# Patient Record
Sex: Female | Born: 1975 | Race: White | Hispanic: No | Marital: Single | Smoking: Current every day smoker
Health system: Southern US, Community
[De-identification: ages and names within clinical notes are randomized; demographics above are authoritative.]

## PROBLEM LIST (undated history)

## (undated) DIAGNOSIS — G629 Polyneuropathy, unspecified: Secondary | ICD-10-CM

## (undated) DIAGNOSIS — D649 Anemia, unspecified: Secondary | ICD-10-CM

## (undated) DIAGNOSIS — F1010 Alcohol abuse, uncomplicated: Secondary | ICD-10-CM

## (undated) HISTORY — PX: GASTRIC BYPASS: SHX52

---

## 2018-03-06 ENCOUNTER — Emergency Department: Payer: Self-pay

## 2018-03-06 ENCOUNTER — Emergency Department
Admission: EM | Admit: 2018-03-06 | Discharge: 2018-03-06 | Disposition: A | Discharge status: Home / Self Care | Payer: Self-pay | Attending: Emergency Medicine | Admitting: Emergency Medicine

## 2018-03-06 DIAGNOSIS — R55 Syncope and collapse: Secondary | ICD-10-CM

## 2018-03-06 DIAGNOSIS — Y999 Unspecified external cause status: Secondary | ICD-10-CM | POA: Insufficient documentation

## 2018-03-06 DIAGNOSIS — Y9289 Other specified places as the place of occurrence of the external cause: Secondary | ICD-10-CM | POA: Insufficient documentation

## 2018-03-06 DIAGNOSIS — Y9389 Activity, other specified: Secondary | ICD-10-CM | POA: Insufficient documentation

## 2018-03-06 DIAGNOSIS — S0990XA Unspecified injury of head, initial encounter: Secondary | ICD-10-CM | POA: Insufficient documentation

## 2018-03-06 DIAGNOSIS — W01198A Fall on same level from slipping, tripping and stumbling with subsequent striking against other object, initial encounter: Secondary | ICD-10-CM | POA: Insufficient documentation

## 2018-03-06 DIAGNOSIS — F17210 Nicotine dependence, cigarettes, uncomplicated: Secondary | ICD-10-CM | POA: Insufficient documentation

## 2018-03-06 LAB — CBC
HCT: 30.8 % — ABNORMAL LOW (ref 36.0–46.0)
Hemoglobin: 9.2 g/dL — ABNORMAL LOW (ref 12.0–15.0)
MCH: 23.1 pg — ABNORMAL LOW (ref 26.0–34.0)
MCHC: 29.9 g/dL — AB (ref 30.0–36.0)
MCV: 77.2 fL — ABNORMAL LOW (ref 80.0–100.0)
Platelets: 187 10*3/uL (ref 150–400)
RBC: 3.99 MIL/uL (ref 3.87–5.11)
RDW: 24.1 % — AB (ref 11.5–15.5)
WBC: 5.6 10*3/uL (ref 4.0–10.5)
nRBC: 0 % (ref 0.0–0.2)

## 2018-03-06 LAB — COMPREHENSIVE METABOLIC PANEL
ALT: 46 U/L — ABNORMAL HIGH (ref 0–44)
AST: 65 U/L — ABNORMAL HIGH (ref 15–41)
Albumin: 3.7 g/dL (ref 3.5–5.0)
Alkaline Phosphatase: 160 U/L — ABNORMAL HIGH (ref 38–126)
Anion gap: 12 (ref 5–15)
BUN: 6 mg/dL (ref 6–20)
CO2: 21 mmol/L — ABNORMAL LOW (ref 22–32)
Calcium: 8.1 mg/dL — ABNORMAL LOW (ref 8.9–10.3)
Chloride: 102 mmol/L (ref 98–111)
Creatinine, Ser: 0.44 mg/dL (ref 0.44–1.00)
GFR calc non Af Amer: >60 mL/min (ref >60)
Glucose, Bld: 75 mg/dL (ref 70–99)
Potassium: 3.7 mmol/L (ref 3.5–5.1)
Sodium: 135 mmol/L (ref 135–145)
TOTAL PROTEIN: 6.7 g/dL (ref 6.5–8.1)
Total Bilirubin: 0.6 mg/dL (ref 0.3–1.2)

## 2018-03-06 MED ORDER — SODIUM CHLORIDE 0.9 % IV SOLN
1000.0000 mL | Freq: Once | INTRAVENOUS | Status: AC
  Administered 2018-03-06: 1000 mL via INTRAVENOUS

## 2018-03-06 MED ORDER — ACETAMINOPHEN 325 MG PO TABS
650.0000 mg | ORAL_TABLET | Freq: Once | ORAL | Status: AC
  Administered 2018-03-06: 650 mg via ORAL
  Filled 2018-03-06: qty 2

## 2018-03-06 NOTE — ED Triage Notes (Signed)
Pt to ED from home via [facility] c/o fall. Per EMS pt had a syncopal episode after dinner family report pt "passed out" hitting her head; hematoma noted to occipital region in addition to left shoulder. EMS reports patient has had approximately 7 beers since 11am today, and had 1 episode of vomiting and 4mg  of zofran was administered. Pt presents A&Ox4, RR even and unlabored, slin wpd.

## 2018-03-06 NOTE — ED Provider Notes (Signed)
Per request of ER MD, writer provided the pt. with information and instructions on how to access [facility] ([facility])   Writer also advised the patient to call the toll free phone on insurance card to assist with identifying in network counselors and agencies .

## 2018-03-06 NOTE — ED Provider Notes (Signed)
[facility] Emergency Department Provider Note   ____________________________________________    I have reviewed the triage vital signs and the nursing notes.   HISTORY  Chief Complaint Fall     HPI Ellen Smith is a 43 y.o. female who presents after a fall.  Patient reports that she is a heavy drinker typically drinks about 12 beers per day.  However over the last 3 days she has tried to cut back.  Today she only had about 6 or 7 beers.  After dinner she stood up to go put her plate in the sink and apparently syncopized.  No seizure activity witnessed by family.  No postictal period.  She was apparently briefly unconscious, when she woke up she felt nauseated and had to vomit.  Reports some pain to her posterior scalp.  No neck pain.  No abdominal pain or chest pain.  No back pain.  No extremity injuries except for mild bruising to the left shoulder  History reviewed. No pertinent past medical history.  There are no active problems to display for this patient.   Past Surgical History:  Procedure Laterality Date  . GASTRIC BYPASS      Prior to Admission medications   Not on File     Allergies Ceclor [cefaclor]  History reviewed. No pertinent family history.  Social History Social History   Tobacco Use  . Smoking status: Current Every Day Smoker    Packs/day: 1.00    Years: 20.00    Pack years: 20.00    Types: Cigarettes  . Smokeless tobacco: Never Used  Substance Use Topics  . Alcohol use: Yes    Alcohol/week: 12.0 standard drinks    Types: 12 Cans of beer per week  . Drug use: Not on file    Review of Systems  Constitutional: No fever/chills Eyes: No visual changes.  ENT: No neck pain Cardiovascular: Denies chest pain. Respiratory: Denies shortness of breath. Gastrointestinal: No abdominal pain. Genitourinary: No groin injury Musculoskeletal: Negative for back pain. Skin: Bruise left shoulder Neurological: No focal  weakness   ____________________________________________   PHYSICAL EXAM:  VITAL SIGNS: ED Triage Vitals  Enc Vitals Group     BP 03/06/18 1853 (!) 178/102     Pulse Rate 03/06/18 1853 100     Resp --      Temp 03/06/18 1853 99 F (37.2 C)     Temp Source 03/06/18 1853 Oral     SpO2 03/06/18 1853 97 %     Weight 03/06/18 1854 68 kg (150 lb)     Height 03/06/18 1854 1.626 m (5\' 4" )     Head Circumference --      Peak Flow --      Pain Score 03/06/18 1854 8     Pain Loc --      Pain Edu? --      Excl. in GC? --     Constitutional: Alert and oriented. No acute distress.  Eyes: Conjunctivae are normal.  Head: Hematoma, small posterior scalp, no bleeding Nose: No swelling or epistaxis Mouth/Throat: Mucous membranes are moist.   Neck:  Painless ROM no vertebral tenderness to palpation Cardiovascular: Normal rate, regular rhythm. Grossly normal heart sounds.  Good peripheral circulation. Respiratory: Normal respiratory effort.  No retractions. Lungs CTAB. Gastrointestinal: Soft and nontender. No distention.  Musculoskeletal:  Warm and well perfused Neurologic:  Normal speech and language. Skin:  Skin is warm, dry and intact. No rash noted. Psychiatric: Mood and affect  are normal. Speech and behavior are normal.  ____________________________________________   LABS (all labs ordered are listed, but only abnormal results are displayed)  Labs Reviewed  CBC - Abnormal; Notable for the following components:      Result Value   Hemoglobin 9.2 (*)    HCT 30.8 (*)    MCV 77.2 (*)    MCH 23.1 (*)    MCHC 29.9 (*)    RDW 24.1 (*)    All other components within normal limits  COMPREHENSIVE METABOLIC PANEL - Abnormal; Notable for the following components:   CO2 21 (*)    Calcium 8.1 (*)    AST 65 (*)    ALT 46 (*)    Alkaline Phosphatase 160 (*)    All other components within normal limits   ____________________________________________  EKG  ED ECG REPORT I, Jene Everyobert  Hiedi Touchton, the attending physician, personally viewed and interpreted this ECG.  Date: 03/06/2018  Rhythm: normal sinus rhythm QRS Axis: normal Intervals: normal ST/T Wave abnormalities: normal Narrative Interpretation: no evidence of acute ischemia  ____________________________________________  RADIOLOGY  CT head unremarkable ____________________________________________   PROCEDURES  Procedure(s) performed: No  Procedures   Critical Care performed: No ____________________________________________   INITIAL IMPRESSION / ASSESSMENT AND PLAN / ED COURSE  Pertinent labs & imaging results that were available during my care of the patient were reviewed by me and considered in my medical decision making (see chart for details).  Patient presents after syncopal episode, likely related to EtOH withdrawal.  Will check labs, give IV fluids, obtain CT head given trauma and reevaluate  Patient's CT head reassuring. Asked TTS to provide detox resources at patient's request. Lab work overall reassuring.    ____________________________________________   FINAL CLINICAL IMPRESSION(S) / ED DIAGNOSES  Final diagnoses:  Injury of head, initial encounter  Syncope, unspecified syncope type        Note:  This document was prepared using Dragon voice recognition software and may include unintentional dictation errors.   Jene EveryKinner, [place] Dimmitt, MD 03/06/18 2027

## 2018-09-01 ENCOUNTER — Inpatient Hospital Stay
Admission: EM | Admit: 2018-09-01 | Discharge: 2018-09-04 | DRG: 917 | Disposition: A | Discharge status: Home / Self Care | Payer: Self-pay | Attending: Internal Medicine | Admitting: Internal Medicine

## 2018-09-01 ENCOUNTER — Other Ambulatory Visit: Payer: Self-pay

## 2018-09-01 DIAGNOSIS — Z20828 Contact with and (suspected) exposure to other viral communicable diseases: Secondary | ICD-10-CM | POA: Diagnosis present

## 2018-09-01 DIAGNOSIS — Z79899 Other long term (current) drug therapy: Secondary | ICD-10-CM

## 2018-09-01 DIAGNOSIS — F1110 Opioid abuse, uncomplicated: Secondary | ICD-10-CM | POA: Diagnosis present

## 2018-09-01 DIAGNOSIS — F322 Major depressive disorder, single episode, severe without psychotic features: Secondary | ICD-10-CM | POA: Diagnosis present

## 2018-09-01 DIAGNOSIS — T50901A Poisoning by unspecified drugs, medicaments and biological substances, accidental (unintentional), initial encounter: Secondary | ICD-10-CM | POA: Diagnosis present

## 2018-09-01 DIAGNOSIS — J9601 Acute respiratory failure with hypoxia: Secondary | ICD-10-CM | POA: Diagnosis present

## 2018-09-01 DIAGNOSIS — D72829 Elevated white blood cell count, unspecified: Secondary | ICD-10-CM | POA: Diagnosis present

## 2018-09-01 DIAGNOSIS — E875 Hyperkalemia: Secondary | ICD-10-CM | POA: Diagnosis present

## 2018-09-01 DIAGNOSIS — F329 Major depressive disorder, single episode, unspecified: Secondary | ICD-10-CM | POA: Diagnosis present

## 2018-09-01 DIAGNOSIS — R0902 Hypoxemia: Secondary | ICD-10-CM

## 2018-09-01 DIAGNOSIS — T401X1A Poisoning by heroin, accidental (unintentional), initial encounter: Principal | ICD-10-CM

## 2018-09-01 DIAGNOSIS — F1020 Alcohol dependence, uncomplicated: Secondary | ICD-10-CM | POA: Diagnosis present

## 2018-09-01 DIAGNOSIS — T402X1A Poisoning by other opioids, accidental (unintentional), initial encounter: Secondary | ICD-10-CM | POA: Diagnosis present

## 2018-09-01 DIAGNOSIS — F17210 Nicotine dependence, cigarettes, uncomplicated: Secondary | ICD-10-CM | POA: Diagnosis present

## 2018-09-01 DIAGNOSIS — T391X1A Poisoning by 4-Aminophenol derivatives, accidental (unintentional), initial encounter: Secondary | ICD-10-CM | POA: Diagnosis present

## 2018-09-01 DIAGNOSIS — D649 Anemia, unspecified: Secondary | ICD-10-CM | POA: Diagnosis present

## 2018-09-01 DIAGNOSIS — F10229 Alcohol dependence with intoxication, unspecified: Secondary | ICD-10-CM | POA: Diagnosis present

## 2018-09-01 DIAGNOSIS — Z8249 Family history of ischemic heart disease and other diseases of the circulatory system: Secondary | ICD-10-CM

## 2018-09-01 DIAGNOSIS — F419 Anxiety disorder, unspecified: Secondary | ICD-10-CM | POA: Diagnosis present

## 2018-09-01 HISTORY — DX: Alcohol abuse, uncomplicated: F10.10

## 2018-09-01 LAB — ACETAMINOPHEN LEVEL: Acetaminophen (Tylenol), Serum: <10 ug/mL — ABNORMAL LOW (ref 10–30)

## 2018-09-01 MED ORDER — NALOXONE HCL 2 MG/2ML IJ SOSY
1.0000 mg | PREFILLED_SYRINGE | Freq: Once | INTRAMUSCULAR | Status: AC
  Administered 2018-09-01: 1 mg via INTRAVENOUS

## 2018-09-01 MED ORDER — NALOXONE HCL 2 MG/2ML IJ SOSY
PREFILLED_SYRINGE | INTRAMUSCULAR | Status: AC
  Administered 2018-09-01: 23:00:00
  Filled 2018-09-01: qty 2

## 2018-09-01 MED ORDER — NALOXONE HCL 4 MG/0.1ML NA LIQD: NASAL | 1 refills | Status: DC

## 2018-09-01 MED ORDER — NALOXONE HCL 2 MG/2ML IJ SOSY
PREFILLED_SYRINGE | INTRAMUSCULAR | Status: AC
  Filled 2018-09-01: qty 2

## 2018-09-01 NOTE — ED Notes (Signed)
Patient placed on nonrebreather due to apnea while sleeping

## 2018-09-01 NOTE — ED Notes (Signed)
Lab called for tylenol level add on.

## 2018-09-01 NOTE — ED Notes (Signed)
Patient denies trying to hurt herself, states she was just trying to get high, did a little to much heroin. Patient awakens to name, answeres questions appropriately when asked. But can not stay awake long enough to keep her stas up, 3lnc applied, sating 96%. md at bedside to assess, safety maintained. Will monitor.

## 2018-09-01 NOTE — ED Notes (Signed)
Patient awakens to name, but falls right back to sleep. Vss, safety maintained will continue to monitor.

## 2018-09-01 NOTE — ED Triage Notes (Signed)
As per patient was home doing heroin, xanax, percocet's and drinking with her girlfriend. Had a little to much. Denies SI. Patient unresponsive upon ems arrival, 3mg  intra nasal Narcan given prior to ed arrival. Patient unable to stay awake  Long enough to answer triage questions.

## 2018-09-01 NOTE — ED Notes (Signed)
Patient aroused after Narcan dose, asking for fiance.

## 2018-09-01 NOTE — ED Notes (Signed)
Occasional desats to 80s, OK per MD to give 1mg  Narcan

## 2018-09-01 NOTE — ED Provider Notes (Signed)
[facility] Emergency Department Provider Note   ____________________________________________   I have reviewed the triage vital signs and the nursing notes.   HISTORY  Chief Complaint Drug Overdose   History limited by: Not Limited   HPI Ellen Smith is a 43 y.o. female who presents to the emergency department today after heroin overdose. The patient states she was trying to get high. Denies any thoughts of wanting to harm herself. Also states she took xanax, alcohol and pain pills. Denies taking combination pain medication. Patient did receive narcan prior to arrival to the emergency department.   Records reviewed. Per medical record review patient has a history of ER visit for alcohol intoxication earlier this month at outside hospital.   No past medical history on file.  There are no active problems to display for this patient.   Past Surgical History:  Procedure Laterality Date  . GASTRIC BYPASS      Prior to Admission medications   Not on File    Allergies Ceclor [cefaclor]  No family history on file.  Social History Social History   Tobacco Use  . Smoking status: Current Every Day Smoker    Packs/day: 1.00    Years: 20.00    Pack years: 20.00    Types: Cigarettes  . Smokeless tobacco: Never Used  Substance Use Topics  . Alcohol use: Yes    Alcohol/week: 12.0 standard drinks    Types: 12 Cans of beer per week  . Drug use: Not on file    Review of Systems Constitutional: No fever/chills Eyes: No visual changes. ENT: No sore throat. Cardiovascular: Denies chest pain. Respiratory: Denies shortness of breath. Gastrointestinal: No abdominal pain.  No nausea, no vomiting.  No diarrhea.   Genitourinary: Negative for dysuria. Musculoskeletal: Negative for back pain. Skin: Negative for rash. Neurological: Negative for headaches, focal weakness or numbness.  ____________________________________________   PHYSICAL EXAM:  VITAL  SIGNS: ED Triage Vitals  Enc Vitals Group     BP --      Pulse Rate 09/01/18 1800 (!) 106     Resp 09/01/18 1800 18     Temp 09/01/18 1800 98.6 F (37 C)     Temp Source 09/01/18 1800 Oral     SpO2 09/01/18 1800 96 %     Weight 09/01/18 1802 148 lb (67.1 kg)     Height 09/01/18 1802 5\' 3"  (1.6 m)     Head Circumference --      Peak Flow --      Pain Score 09/01/18 1801 0   Constitutional: Slightly somnolent, awakens easily to verbal stimuli.  Eyes: Conjunctivae are normal.  ENT      Head: Normocephalic and atraumatic.      Nose: No congestion/rhinnorhea.      Mouth/Throat: Mucous membranes are moist.      Neck: No stridor. Hematological/Lymphatic/Immunilogical: No cervical lymphadenopathy. Cardiovascular: Normal rate, regular rhythm.  No murmurs, rubs, or gallops.  Respiratory: Normal respiratory effort without tachypnea nor retractions. Breath sounds are clear and equal bilaterally. No wheezes/rales/rhonchi. Gastrointestinal: Soft and non tender. No rebound. No guarding.  Genitourinary: Deferred Musculoskeletal: Normal range of motion in all extremities. No lower extremity edema. Neurologic:  Slightly somnolent, awakens easily to verbal stimuli.  Skin:  Skin is warm, dry and intact. No rash noted. Psychiatric: Mood and affect are normal. Speech and behavior are normal. Patient exhibits appropriate insight and judgment.  ____________________________________________    LABS (pertinent positives/negatives)  Acetaminophen <10  ____________________________________________  EKG  None  ____________________________________________    RADIOLOGY  None  ____________________________________________   PROCEDURES  Procedures  ____________________________________________   INITIAL IMPRESSION / ASSESSMENT AND PLAN / ED COURSE  Pertinent labs & imaging results that were available during my care of the patient were reviewed by me and considered in my medical decision  making (see chart for details).   Patient presented to the emergency department today after heroin overdose. Patient denies thoughts of self harm. Did receive narcan prior to arrival. Was slightly somnolent but wakes easily with verbal stimuli. Will plan on observation.  Patient would have some desaturation while sleeping. States she thinks she might have sleep apnea. It does run in the family. The patient was given another dose of narcan to help her stay more awake to help oxygenation.    ____________________________________________   FINAL CLINICAL IMPRESSION(S) / ED DIAGNOSES  Opioid overdose  Note: This dictation was prepared with Dragon dictation. Any transcriptional errors that result from this process are unintentional     Phineas SemenGoodman, Datra Clary, MD 09/02/18 1730

## 2018-09-01 NOTE — ED Notes (Signed)
Patient spoke with fiance

## 2018-09-02 ENCOUNTER — Emergency Department: Payer: Self-pay

## 2018-09-02 ENCOUNTER — Encounter: Payer: Self-pay | Admitting: Internal Medicine

## 2018-09-02 DIAGNOSIS — T402X1A Poisoning by other opioids, accidental (unintentional), initial encounter: Secondary | ICD-10-CM

## 2018-09-02 DIAGNOSIS — F1020 Alcohol dependence, uncomplicated: Secondary | ICD-10-CM | POA: Diagnosis present

## 2018-09-02 DIAGNOSIS — F11188 Opioid abuse with other opioid-induced disorder: Secondary | ICD-10-CM

## 2018-09-02 DIAGNOSIS — T50901A Poisoning by unspecified drugs, medicaments and biological substances, accidental (unintentional), initial encounter: Secondary | ICD-10-CM | POA: Diagnosis present

## 2018-09-02 DIAGNOSIS — F1110 Opioid abuse, uncomplicated: Secondary | ICD-10-CM | POA: Diagnosis present

## 2018-09-02 DIAGNOSIS — F10288 Alcohol dependence with other alcohol-induced disorder: Secondary | ICD-10-CM

## 2018-09-02 DIAGNOSIS — R55 Syncope and collapse: Secondary | ICD-10-CM

## 2018-09-02 DIAGNOSIS — F332 Major depressive disorder, recurrent severe without psychotic features: Secondary | ICD-10-CM

## 2018-09-02 DIAGNOSIS — F322 Major depressive disorder, single episode, severe without psychotic features: Secondary | ICD-10-CM | POA: Diagnosis present

## 2018-09-02 LAB — CBC
HCT: 26.6 % — ABNORMAL LOW (ref 36.0–46.0)
HCT: 32.4 % — ABNORMAL LOW (ref 36.0–46.0)
Hemoglobin: 7.2 g/dL — ABNORMAL LOW (ref 12.0–15.0)
Hemoglobin: 8.9 g/dL — ABNORMAL LOW (ref 12.0–15.0)
MCH: 27 pg (ref 26.0–34.0)
MCH: 27.1 pg (ref 26.0–34.0)
MCHC: 27.1 g/dL — ABNORMAL LOW (ref 30.0–36.0)
MCHC: 27.5 g/dL — ABNORMAL LOW (ref 30.0–36.0)
MCV: 98.8 fL (ref 80.0–100.0)
MCV: 99.6 fL (ref 80.0–100.0)
Platelets: 178 10*3/uL (ref 150–400)
Platelets: 294 10*3/uL (ref 150–400)
RBC: 2.67 MIL/uL — ABNORMAL LOW (ref 3.87–5.11)
RBC: 3.28 MIL/uL — ABNORMAL LOW (ref 3.87–5.11)
RDW: 21.9 % — ABNORMAL HIGH (ref 11.5–15.5)
RDW: 22.1 % — ABNORMAL HIGH (ref 11.5–15.5)
WBC: 18.9 10*3/uL — ABNORMAL HIGH (ref 4.0–10.5)
WBC: 9.7 10*3/uL (ref 4.0–10.5)
nRBC: 0 % (ref 0.0–0.2)
nRBC: 0 % (ref 0.0–0.2)

## 2018-09-02 LAB — BASIC METABOLIC PANEL
Anion gap: 9 (ref 5–15)
BUN: 10 mg/dL (ref 6–20)
CO2: 23 mmol/L (ref 22–32)
Calcium: 8.1 mg/dL — ABNORMAL LOW (ref 8.9–10.3)
Chloride: 107 mmol/L (ref 98–111)
Creatinine, Ser: 0.48 mg/dL (ref 0.44–1.00)
GFR calc Af Amer: >60 mL/min (ref >60)
GFR calc non Af Amer: >60 mL/min (ref >60)
Glucose, Bld: 79 mg/dL (ref 70–99)
Potassium: 4.5 mmol/L (ref 3.5–5.1)
Sodium: 139 mmol/L (ref 135–145)

## 2018-09-02 LAB — HEMOGLOBIN AND HEMATOCRIT, BLOOD
HCT: 26.1 % — ABNORMAL LOW (ref 36.0–46.0)
HCT: 26 % — ABNORMAL LOW (ref 36.0–46.0)
Hemoglobin: 7.1 g/dL — ABNORMAL LOW (ref 12.0–15.0)
Hemoglobin: 7.1 g/dL — ABNORMAL LOW (ref 12.0–15.0)

## 2018-09-02 LAB — COMPREHENSIVE METABOLIC PANEL
ALT: 54 U/L — ABNORMAL HIGH (ref 0–44)
AST: 155 U/L — ABNORMAL HIGH (ref 15–41)
Albumin: 3.6 g/dL (ref 3.5–5.0)
Alkaline Phosphatase: 483 U/L — ABNORMAL HIGH (ref 38–126)
Anion gap: 10 (ref 5–15)
BUN: 10 mg/dL (ref 6–20)
CO2: 23 mmol/L (ref 22–32)
Calcium: 8.4 mg/dL — ABNORMAL LOW (ref 8.9–10.3)
Chloride: 104 mmol/L (ref 98–111)
Creatinine, Ser: 0.65 mg/dL (ref 0.44–1.00)
GFR calc Af Amer: >60 mL/min (ref >60)
GFR calc non Af Amer: >60 mL/min (ref >60)
Glucose, Bld: 77 mg/dL (ref 70–99)
Potassium: 5.3 mmol/L — ABNORMAL HIGH (ref 3.5–5.1)
Sodium: 137 mmol/L (ref 135–145)
Total Bilirubin: 0.9 mg/dL (ref 0.3–1.2)
Total Protein: 7.5 g/dL (ref 6.5–8.1)

## 2018-09-02 LAB — PHOSPHORUS: Phosphorus: 4.2 mg/dL (ref 2.5–4.6)

## 2018-09-02 LAB — MAGNESIUM: Magnesium: 2.3 mg/dL (ref 1.7–2.4)

## 2018-09-02 LAB — FERRITIN: Ferritin: 32 ng/mL (ref 11–307)

## 2018-09-02 LAB — URINALYSIS, COMPLETE (UACMP) WITH MICROSCOPIC
Bacteria, UA: NONE SEEN
Bilirubin Urine: NEGATIVE
Glucose, UA: NEGATIVE mg/dL
Hgb urine dipstick: NEGATIVE
Ketones, ur: 20 mg/dL — AB
Leukocytes,Ua: NEGATIVE
Nitrite: NEGATIVE
Protein, ur: 30 mg/dL — AB
Specific Gravity, Urine: 1.016 (ref 1.005–1.030)
pH: 6 (ref 5.0–8.0)

## 2018-09-02 LAB — MRSA PCR SCREENING: MRSA by PCR: NEGATIVE

## 2018-09-02 LAB — URINE DRUG SCREEN, QUALITATIVE (ARMC ONLY)
Amphetamines, Ur Screen: NOT DETECTED
Barbiturates, Ur Screen: NOT DETECTED
Benzodiazepine, Ur Scrn: NOT DETECTED
Cannabinoid 50 Ng, Ur ~~LOC~~: NOT DETECTED
Cocaine Metabolite,Ur ~~LOC~~: NOT DETECTED
MDMA (Ecstasy)Ur Screen: NOT DETECTED
Methadone Scn, Ur: NOT DETECTED
Opiate, Ur Screen: NOT DETECTED
Phencyclidine (PCP) Ur S: NOT DETECTED
Tricyclic, Ur Screen: NOT DETECTED

## 2018-09-02 LAB — ETHANOL: Alcohol, Ethyl (B): <10 mg/dL (ref <10)

## 2018-09-02 LAB — GLUCOSE, CAPILLARY
Glucose-Capillary: 71 mg/dL (ref 70–99)
Glucose-Capillary: 97 mg/dL (ref 70–99)
Glucose-Capillary: 94 mg/dL (ref 70–99)
Glucose-Capillary: 91 mg/dL (ref 70–99)

## 2018-09-02 LAB — IRON AND TIBC
Iron: 89 ug/dL (ref 28–170)
Saturation Ratios: 20 % (ref 10.4–31.8)
TIBC: 439 ug/dL (ref 250–450)
UIBC: 350 ug/dL

## 2018-09-02 LAB — VITAMIN B12: Vitamin B-12: 499 pg/mL (ref 180–914)

## 2018-09-02 LAB — HCG, QUANTITATIVE, PREGNANCY: hCG, Beta Chain, Quant, S: 1 m[IU]/mL (ref <5)

## 2018-09-02 LAB — SARS CORONAVIRUS 2 BY RT PCR (HOSPITAL ORDER, PERFORMED IN ~~LOC~~ HOSPITAL LAB): SARS Coronavirus 2: NEGATIVE

## 2018-09-02 LAB — SALICYLATE LEVEL: Salicylate Lvl: <7 mg/dL (ref 2.8–30.0)

## 2018-09-02 MED ORDER — NALOXONE HCL 4 MG/10ML IJ SOLN
1.0000 mg/h | INTRAVENOUS | Status: DC
  Administered 2018-09-02: 1 mg/h via INTRAVENOUS
  Filled 2018-09-02: qty 10
  Filled 2018-09-02: qty 4

## 2018-09-02 MED ORDER — ONDANSETRON HCL 4 MG/2ML IJ SOLN
INTRAMUSCULAR | Status: AC
  Administered 2018-09-02: 01:00:00 4 mg via INTRAVENOUS
  Filled 2018-09-02: qty 4

## 2018-09-02 MED ORDER — ADULT MULTIVITAMIN W/MINERALS CH
1.0000 | ORAL_TABLET | Freq: Every day | ORAL | Status: DC
  Administered 2018-09-02 – 2018-09-04 (×3): 1 via ORAL
  Filled 2018-09-02 (×3): qty 1

## 2018-09-02 MED ORDER — ENOXAPARIN SODIUM 40 MG/0.4ML ~~LOC~~ SOLN
40.0000 mg | SUBCUTANEOUS | Status: DC
  Administered 2018-09-02: 05:00:00 40 mg via SUBCUTANEOUS
  Filled 2018-09-02: qty 0.4

## 2018-09-02 MED ORDER — FAMOTIDINE 20 MG PO TABS
20.0000 mg | ORAL_TABLET | Freq: Two times a day (BID) | ORAL | Status: DC
  Administered 2018-09-02 – 2018-09-04 (×4): 20 mg via ORAL
  Filled 2018-09-02 (×4): qty 1

## 2018-09-02 MED ORDER — NALOXONE HCL 2 MG/2ML IJ SOSY
PREFILLED_SYRINGE | INTRAMUSCULAR | Status: AC
  Filled 2018-09-02: qty 2

## 2018-09-02 MED ORDER — NALOXONE HCL 2 MG/2ML IJ SOSY
2.0000 mg | PREFILLED_SYRINGE | Freq: Once | INTRAMUSCULAR | Status: AC
  Administered 2018-09-02: 2 mg via INTRAVENOUS

## 2018-09-02 MED ORDER — HYDROXYZINE HCL 25 MG PO TABS
25.0000 mg | ORAL_TABLET | Freq: Two times a day (BID) | ORAL | Status: DC | PRN
  Filled 2018-09-02 (×2): qty 1

## 2018-09-02 MED ORDER — NICOTINE 21 MG/24HR TD PT24
21.0000 mg | MEDICATED_PATCH | Freq: Every day | TRANSDERMAL | Status: DC
  Administered 2018-09-02 – 2018-09-04 (×3): 21 mg via TRANSDERMAL
  Filled 2018-09-02 (×3): qty 1

## 2018-09-02 MED ORDER — FOLIC ACID 1 MG PO TABS
1.0000 mg | ORAL_TABLET | Freq: Every day | ORAL | Status: DC
  Administered 2018-09-02 – 2018-09-04 (×3): 1 mg via ORAL
  Filled 2018-09-02 (×3): qty 1

## 2018-09-02 MED ORDER — HALOPERIDOL LACTATE 5 MG/ML IJ SOLN
INTRAMUSCULAR | Status: AC
  Administered 2018-09-02: 01:00:00 2 mg via INTRAVENOUS
  Filled 2018-09-02: qty 1

## 2018-09-02 MED ORDER — THIAMINE HCL 100 MG/ML IJ SOLN
100.0000 mg | Freq: Every day | INTRAMUSCULAR | Status: DC
  Filled 2018-09-02: qty 2

## 2018-09-02 MED ORDER — ONDANSETRON HCL 4 MG PO TABS: 4.0000 mg | ORAL_TABLET | Freq: Four times a day (QID) | ORAL | Status: DC | PRN

## 2018-09-02 MED ORDER — NALOXONE HCL 2 MG/2ML IJ SOSY
0.2500 mg/h | PREFILLED_SYRINGE | INTRAVENOUS | Status: DC
  Filled 2018-09-02: qty 4

## 2018-09-02 MED ORDER — LORAZEPAM 1 MG PO TABS
1.0000 mg | ORAL_TABLET | Freq: Four times a day (QID) | ORAL | Status: DC | PRN
  Administered 2018-09-02 – 2018-09-03 (×5): 1 mg via ORAL
  Filled 2018-09-02 (×5): qty 1

## 2018-09-02 MED ORDER — SODIUM CHLORIDE 0.9 % IV SOLN
INTRAVENOUS | Status: DC
  Administered 2018-09-02: 05:00:00 via INTRAVENOUS

## 2018-09-02 MED ORDER — ONDANSETRON HCL 4 MG/2ML IJ SOLN: 4.0000 mg | Freq: Four times a day (QID) | INTRAMUSCULAR | Status: DC | PRN

## 2018-09-02 MED ORDER — FLUOXETINE HCL 20 MG PO CAPS
40.0000 mg | ORAL_CAPSULE | Freq: Every day | ORAL | Status: DC
  Administered 2018-09-03 – 2018-09-04 (×2): 40 mg via ORAL
  Filled 2018-09-02 (×2): qty 2

## 2018-09-02 MED ORDER — ONDANSETRON HCL 4 MG/2ML IJ SOLN
4.0000 mg | Freq: Once | INTRAMUSCULAR | Status: AC
  Administered 2018-09-02: 01:00:00 4 mg via INTRAVENOUS

## 2018-09-02 MED ORDER — LACTATED RINGERS IV SOLN
INTRAVENOUS | Status: DC
  Administered 2018-09-02: 12:00:00 via INTRAVENOUS
  Administered 2018-09-03: 900 mL via INTRAVENOUS

## 2018-09-02 MED ORDER — LORAZEPAM 2 MG/ML IJ SOLN: 1.0000 mg | Freq: Four times a day (QID) | INTRAMUSCULAR | Status: DC | PRN

## 2018-09-02 MED ORDER — CHLORHEXIDINE GLUCONATE CLOTH 2 % EX PADS
6.0000 | MEDICATED_PAD | Freq: Every day | CUTANEOUS | Status: DC
  Administered 2018-09-02 – 2018-09-04 (×3): 6 via TOPICAL

## 2018-09-02 MED ORDER — NALOXONE HCL 2 MG/2ML IJ SOSY
1.0000 mg/h | PREFILLED_SYRINGE | INTRAVENOUS | Status: DC
  Administered 2018-09-02: 0.5 mg/h via INTRAVENOUS
  Administered 2018-09-02 (×2): 1 mg/h via INTRAVENOUS
  Filled 2018-09-02 (×3): qty 4

## 2018-09-02 MED ORDER — HALOPERIDOL LACTATE 5 MG/ML IJ SOLN
2.0000 mg | Freq: Once | INTRAMUSCULAR | Status: AC
  Administered 2018-09-02: 2 mg via INTRAVENOUS

## 2018-09-02 NOTE — ED Notes (Signed)
Patient taken off non-rebreather to assess ability to oxygenate

## 2018-09-02 NOTE — Consult Note (Signed)
Name: Ellen Smith MRN: [id number] DOB: February 21, 1975    ADMISSION DATE:  09/01/2018 CONSULTATION DATE: 09/02/2018  REFERRING MD : Dr. Leslye Peer   CHIEF COMPLAINT: Unintentional Overdose  BRIEF PATIENT DESCRIPTION:  43 yo female admitted with acute hypoxic respiratory failure secondary to aspiration in setting of unintentional drug overdose requiring narcan gtt   SIGNIFICANT EVENTS/STUDIES:  07/29-Pt admitted to the stepdown unit on narcan gtt   HISTORY OF PRESENT ILLNESS:   This is a 43 yo female with no significant PMH presented to [facility] ER on 07/28 from home via EMS with suspected unintentional drug overdose.  Per ER notes pts girlfriend reported pt drank alcohol and used heroin/xanax/percocet's in an attempt to get high the night of 09/01/2018. Pt endorses drinking 6-10 beers daily and using prescription drugs once a week. Upon EMS arrival at pts home pt unresponsive she received 3 mg intra nasal narcan with slight improvement in mentation, pt able to answer questions but fell asleep immediately.  In the ER pt placed on 3L via nasal canula due to intermittent hypoxia O2 sats 80's.  She received a total of 3 mg iv narcan in the ER, however mentation only improved briefly and pt had periods of apnea and hypoxia O2 sats 70's requiring NRB.  Therefore, narcan gtt initiated. Urine drug screen negative and COVID-19 negative. Lab results revealed K+ 5.3, alk phos 483, AST 155, ALT 54, wbc 18.9, hgb 8.9, alcohol level <10, and CXR negative.  She was subsequently admitted to the stepdown unit by hospitalist team for additional workup and treatment.   PAST MEDICAL HISTORY :   has no past medical history on file.  has a past surgical history that includes Gastric bypass. Prior to Admission medications   Medication Sig Start Date End Date Taking? Authorizing Provider  Aspirin-Acetaminophen-Caffeine (GOODY HEADACHE PO) Take 1 packet by mouth every 4 (four) hours as needed.   Yes [provider]   diphenhydrAMINE (BENADRYL) 25 mg capsule Take 25 mg by mouth every 6 (six) hours as needed for allergies.   Yes [provider]  FLUoxetine (PROZAC) 40 MG capsule Take 40 mg by mouth daily.   Yes [provider]  hydrOXYzine (VISTARIL) 50 MG capsule Take 50 mg by mouth 3 (three) times daily as needed.   Yes [provider]  naloxone [facility]) nasal spray 4 mg/0.1 mL To use in case of opioid overdose and difficulty breathing 09/01/18   Nance Pear, MD   Allergies  Allergen Reactions  . Ceclor [Cefaclor] Hives    FAMILY HISTORY:  family history is not on file. SOCIAL HISTORY:  reports that she has been smoking cigarettes. She has a 20.00 pack-year smoking history. She has never used smokeless tobacco. She reports current alcohol use of about 12.0 standard drinks of alcohol per week.  REVIEW OF SYSTEMS:   Constitutional: Negative for fever, chills, weight loss, malaise/fatigue and diaphoresis.  HENT: Negative for hearing loss, ear pain, nosebleeds, congestion, sore throat, neck pain, tinnitus and ear discharge.   Eyes: Negative for blurred vision, double vision, photophobia, pain, discharge and redness.  Respiratory: Negative for cough, hemoptysis, sputum production, shortness of breath, wheezing and stridor.   Cardiovascular: Negative for chest pain, palpitations, orthopnea, claudication, leg swelling and PND.  Gastrointestinal: Negative for heartburn, nausea, vomiting, abdominal pain, diarrhea, constipation, blood in stool and melena.  Genitourinary: Negative for dysuria, urgency, frequency, hematuria and flank pain.  Musculoskeletal: Negative for myalgias, back pain, joint pain and falls.  Skin: Negative for itching  and rash.  Neurological: Negative for dizziness, tingling, tremors, sensory change, speech change, focal weakness, seizures, loss of consciousness, weakness and headaches.  Endo/Heme/Allergies: Negative for environmental allergies and polydipsia.  Does not bruise/bleed easily.  SUBJECTIVE:  Pt states she took pills to get high denies suicidal or homicidal ideations   VITAL SIGNS: Temp:  [98.6 F (37 C)] 98.6 F (37 C) (07/28 1800) Pulse Rate:  [96-123] 121 (07/29 0030) Resp:  [13-21] 17 (07/29 0030) BP: (109-148)/(57-108) 138/85 (07/29 0030) SpO2:  [84 %-100 %] 96 % (07/29 0030) Weight:  [67.1 kg] 67.1 kg (07/28 1802)  PHYSICAL EXAMINATION: General: well developed, well nourished female, NAD  Neuro: lethargic, follows commands, oriented  HEENT: supple, no JVD  Cardiovascular: sinus tachycardia, no R/G  Lungs: clear throughout, even, non labored  Abdomen: +BS x4, soft, non tender, non distended  Musculoskeletal: normal bulk and tone, no edema  Skin: intact no rashes or lesions present   Recent Labs  Lab 09/02/18 0027  NA 137  K 5.3*  CL 104  CO2 23  BUN 10  CREATININE 0.65  GLUCOSE 77   Recent Labs  Lab 09/02/18 0027  HGB 8.9*  HCT 32.4*  WBC 18.9*  PLT 294   Dg Chest Portable 1 View  Result Date: 09/02/2018 CLINICAL DATA:  Hypoxia history of drug abuse EXAM: PORTABLE CHEST 1 VIEW COMPARISON:  None. FINDINGS: The heart size and mediastinal contours are within normal limits. Both lungs are clear. The visualized skeletal structures are unremarkable. IMPRESSION: No active disease. Electronically Signed   By: Donavan Foil M.D.   On: 09/02/2018 01:03    ASSESSMENT / PLAN:  Acute hypoxic respiratory failure secondary to aspiration in setting of drug overdose and ETOH abuse  Prn supplemental O2 for dyspnea and/or hypoxia  Aspiration precautions  Hyperkalemia  Continuous telemetry monitoring  Trend BMP Continue NS _0  ml/hr  Monitor UOP   Elevated liver enzymes secondary to ETOH abuse  Trend hepatic function panel   Leukocytosis  Trend WBC and monitor fever curve Will check pct if elevated with start abx for possible aspiration pneumonia   Anemia without obvious acute blood loss VTE px: subq lovenox   Trend CBC  Monitor for s/sx of bleeding and transfuse for hgb <7  Acute encephalopathy secondary to unintentional drug overdose and ETOH abuse  Urine drug screen pending  Continue narcan gtt-dosing per pharmacy  CIWA protocol  Avoid sedating medications  Polysubstance/ETOH abuse cessation counseling provided  Psychiatry consulted appreciate input   Marda Stalker, [place] Pager [phone] (please enter 7 digits) PCCM Consult Pager [phone] (please enter 7 digits)

## 2018-09-02 NOTE — Progress Notes (Addendum)
Narcan gtt stopped and MD made aware. Pt is drowsy now, but easily arousable and oriented. MD made aware with a plan to keep monitoring pt closely off the narcan gtt. Order still in place if the need to restart occurs.

## 2018-09-02 NOTE — ED Notes (Signed)
Patient ambulatory to restroom with min assist, tolerated well

## 2018-09-02 NOTE — Consult Note (Signed)
[facility] Face-to[facility]Face [facility]iat[facility] Consult   Reason for Consult:  Unintentional overdose Referring Physician:  Hospitalist Patient Identification: Ellen Smith MRN:  [id number] Principal Diagnosis: <principal problem not specified> Diagnosis:  Active Problems:   Overdose   Alcohol dependence (HCC)   Opiate abuse, episodic (HCC)   MDD (major depressive disorder), severe (HCC)   Total Time spent with patient: 45 minutes  Subjective:   Ellen Smith is a 43 y.o. female patient reports that she is feeling better today.  Patient states that yesterday she was using some Percocet and heroin and only took about 10 mg of Percocet.  She states that evidently that was a little too much for her and that is when she blacked out.  She does report excessive alcohol consumption.  She states that on average she drinks about 10-15 beers a day.  She states that she has done this heavily for the last 5 years.  She states that in 2016 she [facility] to [facility] and then remained sober for 1 year afterwards.  She reports that she does there was some depression and anxiety and that she is followed by [facility].  She states that she missed her last appointment and ran out of her Prozac approximately 2 days ago.  She states that she is on Prozac 40 mg a day and Vistaril 25 mg twice a day as needed.  She denies any suicidal or homicidal ideations and denies any hallucinations.  She continues to state that the overdose was never intentional and that this was coming plate asked that an overuse of these drugs.  She also admits that these drugs are not prescribed to her and that she does buy them off the street.  Patient's fianc, Ellen Smith who goes by [facility], was contacted for collateral information at [phone].  She reports that this was not an intentional suicide attempt and there is been no mention of having suicidal thoughts.  She states that she does use the opiates from time to time but is not daily.  She states that she is  very concerned about her alcohol consumption because it has been excessive for quite a while now.  She agrees that she uses 10-15 beers a day but states that she does have days where she drinks more than that.  She states that she has no concerns with her coming home as far as safety except for her alcohol consumption.  She states that she does feel that she needs to go to a residential rehab again so that she can get sober.  She states that she will talk to the patient to discuss with her about going to a residential rehab so that she can get the help she needs.  HPI:  43 y.o. female coming in with altered mental status.  I spoke with the patient's significant other and she stated that the patient is not a drug abuser but does take Percocet.  Apparently she had some heroin also.  The patient's major issue is alcohol abuse.  The patient significant other went out for a few minutes and came back and the patient was face down and gray and she started CPR.  When EMS arrived they gave Narcan and she started coming through.  In the emergency room give another 2 doses of Narcan.  Patient was still hypoxic on 4 L of oxygen.  When I went into the room to evaluate for admission she was less responsive again and needed another dose of Narcan and  was started on a Narcan drip.  Patient not able to give any history at this time.  History obtained from old chart and significant other.  Patient is seen by this provider via face-to-face.  Patient is sitting on the edge of the bed.  Patient is now breathing on room air and oxygen saturation was in the mid 90s, heart rate was WNL, and her blood pressure was WNL.  Patient has continuously denied this being a suicide attempt and that it was an unintentional overdose.  She is was discussing about going back to a residential rehab and at first she refused and then stated that she realizes that that would be the best option for her.  Patient does have a good support system with her  fianc and her fianc is going to encourage the patient to go to a residential rehab.  I have contacted the social work for this unit and discussed with him the plan to assist the patient with residential rehab if possible.  I have sent Dr. Elpidio Anis a secure chat with the recommendations.  At this time the patient does not meet inpatient criteria and is psychiatric cleared.  Past Psychiatric History: depression, anxiety, alcohol dependence, no hospitalizations, one residential rehab in 2016, opiate use  Risk to Self:   Risk to Others:   Prior Inpatient Therapy:   Prior Outpatient Therapy:    Past Medical History:  Past Medical History:  Diagnosis Date  . Alcohol abuse     Past Surgical History:  Procedure Laterality Date  . GASTRIC BYPASS     Family History:  Family History  Problem Relation Age of Onset  . Hypertension Mother    Family Psychiatric  History: Denies Social History:  Social History   Substance and Sexual Activity  Alcohol Use Yes  . Alcohol/week: 12.0 standard drinks  . Types: 12 Cans of beer per week     Social History   Substance and Sexual Activity  Drug Use Not on file    Social History   Socioeconomic History  . Marital status: Single    Spouse name: Not on file  . Number of children: Not on file  . Years of education: Not on file  . Highest education level: Not on file  Occupational History  . Not on file  Social Needs  . Financial resource strain: Not on file  . Food insecurity    Worry: Not on file    Inability: Not on file  . Transportation needs    Medical: Not on file    Non-medical: Not on file  Tobacco Use  . Smoking status: Current Every Day Smoker    Packs/day: 1.00    Years: 20.00    Pack years: 20.00    Types: Cigarettes  . Smokeless tobacco: Never Used  Substance and Sexual Activity  . Alcohol use: Yes    Alcohol/week: 12.0 standard drinks    Types: 12 Cans of beer per week  . Drug use: Not on file  . Sexual activity:  Not on file  Lifestyle  . Physical activity    Days per week: Not on file    Minutes per session: Not on file  . Stress: Not on file  Relationships  . Social Musician on phone: Not on file    Gets together: Not on file    Attends religious service: Not on file    Active member of club or organization: Not on file  Attends meetings of clubs or organizations: Not on file    Relationship status: Not on file  Other Topics Concern  . Not on file  Social History Narrative  . Not on file   Additional Social History:    Allergies:   Allergies  Allergen Reactions  . Ceclor [Cefaclor] Hives    Labs:  Results for orders placed or performed during the hospital encounter of 09/01/18 (from the past 48 hour(s))  Acetaminophen level     Status: Abnormal   Collection Time: 09/01/18  6:08 PM  Result Value Ref Range   Acetaminophen (Tylenol), Serum <10 (L) 10 - 30 ug/mL    Comment: (NOTE) Therapeutic concentrations vary significantly. A range of 10-30 ug/mL  may be an effective concentration for many patients. However, some  are best treated at concentrations outside of this range. Acetaminophen concentrations >150 ug/mL at 4 hours after ingestion  and >50 ug/mL at 12 hours after ingestion are often associated with  toxic reactions. Performed at [facility], [address] [place], [place] [address]   CBC     Status: Abnormal   Collection Time: 09/02/18 12:27 AM  Result Value Ref Range   WBC 18.9 (H) 4.0 - 10.5 K/uL   RBC 3.28 (L) 3.87 - 5.11 MIL/uL   Hemoglobin 8.9 (L) 12.0 - 15.0 g/dL   HCT 32.4 (L) 36.0 - 46.0 %   MCV 98.8 80.0 - 100.0 fL   MCH 27.1 26.0 - 34.0 pg   MCHC 27.5 (L) 30.0 - 36.0 g/dL   RDW 22.1 (H) 11.5 - 15.5 %   Platelets 294 150 - 400 K/uL   nRBC 0.0 0.0 - 0.2 %    Comment: Performed at [facility], [address] [place], [place] [address]  Comprehensive metabolic panel     Status: Abnormal   Collection Time: 09/02/18  12:27 AM  Result Value Ref Range   Sodium 137 135 - 145 mmol/L   Potassium 5.3 (H) 3.5 - 5.1 mmol/L   Chloride 104 98 - 111 mmol/L   CO2 23 22 - 32 mmol/L   Glucose, Bld 77 70 - 99 mg/dL   BUN 10 6 - 20 mg/dL   Creatinine, Ser 0.65 0.44 - 1.00 mg/dL   Calcium 8.4 (L) 8.9 - 10.3 mg/dL   Total Protein 7.5 6.5 - 8.1 g/dL   Albumin 3.6 3.5 - 5.0 g/dL   AST 155 (H) 15 - 41 U/L   ALT 54 (H) 0 - 44 U/L   Alkaline Phosphatase 483 (H) 38 - 126 U/L   Total Bilirubin 0.9 0.3 - 1.2 mg/dL   GFR calc non Af Amer >60 >60 mL/min   GFR calc Af Amer >60 >60 mL/min   Anion gap 10 5 - 15    Comment: Performed at [facility], [address] [place], [place] [address]  hCG, quantitative, pregnancy     Status: None   Collection Time: 09/02/18 12:27 AM  Result Value Ref Range   hCG, Beta Chain, Quant, S 1 <5 mIU/mL    Comment:          GEST. AGE      CONC.  (mIU/mL)   <=1 WEEK        5 - 50     2 WEEKS       50 - 500     3 WEEKS       100 - 10,000     4 WEEKS  1,000 - 30,000     5 WEEKS     3,500 - 115,000   6-8 WEEKS     12,000 - 270,000    12 WEEKS     15,000 - 220,000        FEMALE AND NON-PREGNANT FEMALE:     LESS THAN 5 mIU/mL Performed at [facility], [address] [place], [place] [address]   Urine Drug Screen, Qualitative ([facility] only)     Status: None   Collection Time: 09/02/18 12:27 AM  Result Value Ref Range   Tricyclic, Ur Screen NONE DETECTED NONE DETECTED   Amphetamines, Ur Screen NONE DETECTED NONE DETECTED   MDMA (Ecstasy)Ur Screen NONE DETECTED NONE DETECTED   Cocaine Metabolite,Ur [place] NONE DETECTED NONE DETECTED   Opiate, Ur Screen NONE DETECTED NONE DETECTED   Phencyclidine (PCP) Ur S NONE DETECTED NONE DETECTED   Cannabinoid 50 Ng, Ur [place] NONE DETECTED NONE DETECTED   Barbiturates, Ur Screen NONE DETECTED NONE DETECTED   Benzodiazepine, Ur Scrn NONE DETECTED NONE DETECTED   Methadone Scn, Ur NONE DETECTED NONE DETECTED    Comment:  (NOTE) Tricyclics + metabolites, urine    Cutoff 1000 ng/mL Amphetamines + metabolites, urine  Cutoff 1000 ng/mL MDMA (Ecstasy), urine              Cutoff 500 ng/mL Cocaine Metabolite, urine          Cutoff 300 ng/mL Opiate + metabolites, urine        Cutoff 300 ng/mL Phencyclidine (PCP), urine         Cutoff 25 ng/mL Cannabinoid, urine                 Cutoff 50 ng/mL Barbiturates + metabolites, urine  Cutoff 200 ng/mL Benzodiazepine, urine              Cutoff 200 ng/mL Methadone, urine                   Cutoff 300 ng/mL The urine drug screen provides only a preliminary, unconfirmed analytical test result and should not be used for non-medical purposes. Clinical consideration and professional judgment should be applied to any positive drug screen result due to possible interfering substances. A more specific alternate chemical method must be used in order to obtain a confirmed analytical result. Gas chromatography / mass spectrometry (GC/MS) is the preferred confirmat ory method. Performed at [facility], [address] [place], [place] [address]   Urinalysis, Complete w Microscopic     Status: Abnormal   Collection Time: 09/02/18 12:27 AM  Result Value Ref Range   Color, Urine YELLOW (A) YELLOW   APPearance CLEAR (A) CLEAR   Specific Gravity, Urine 1.016 1.005 - 1.030   pH 6.0 5.0 - 8.0   Glucose, UA NEGATIVE NEGATIVE mg/dL   Hgb urine dipstick NEGATIVE NEGATIVE   Bilirubin Urine NEGATIVE NEGATIVE   Ketones, ur 20 (A) NEGATIVE mg/dL   Protein, ur 30 (A) NEGATIVE mg/dL   Nitrite NEGATIVE NEGATIVE   Leukocytes,Ua NEGATIVE NEGATIVE   RBC / HPF 0-5 0 - 5 RBC/hpf   WBC, UA 6-10 0 - 5 WBC/hpf   Bacteria, UA NONE SEEN NONE SEEN   Squamous Epithelial / LPF 0-5 0 - 5   Mucus PRESENT    Hyaline Casts, UA PRESENT     Comment: Performed at [facility], [address] [place], [place] [address]  SARS Coronavirus 2 (CEPHEID - Performed in [facility] hospital  lab), Hosp Order     Status: None   Collection Time: 09/02/18  1:51 AM   Specimen: Nasopharyngeal Swab  Result Value Ref Range   SARS Coronavirus 2 NEGATIVE NEGATIVE    Comment: (NOTE) If result is NEGATIVE SARS-CoV-2 target nucleic acids are NOT DETECTED. The SARS-CoV-2 RNA is generally detectable in upper and lower  respiratory specimens during the acute phase of infection. The lowest  concentration of SARS-CoV-2 viral copies this assay can detect is 250  copies / mL. A negative result does not preclude SARS-CoV-2 infection  and should not be used as the sole basis for treatment or other  patient management decisions.  A negative result may occur with  improper specimen collection / handling, submission of specimen other  than nasopharyngeal swab, presence of viral mutation(s) within the  areas targeted by this assay, and inadequate number of viral copies  (<250 copies / mL). A negative result must be combined with clinical  observations, patient history, and epidemiological information. If result is POSITIVE SARS-CoV-2 target nucleic acids are DETECTED. The SARS-CoV-2 RNA is generally detectable in upper and lower  respiratory specimens dur ing the acute phase of infection.  Positive  results are indicative of active infection with SARS-CoV-2.  Clinical  correlation with patient history and other diagnostic information is  necessary to determine patient infection status.  Positive results do  not rule out bacterial infection or co-infection with other viruses. If result is PRESUMPTIVE POSTIVE SARS-CoV-2 nucleic acids MAY BE PRESENT.   A presumptive positive result was obtained on the submitted specimen  and confirmed on repeat testing.  While 2019 novel coronavirus  (SARS-CoV-2) nucleic acids may be present in the submitted sample  additional confirmatory testing may be necessary for epidemiological  and / or clinical management purposes  to differentiate between  SARS-CoV-2 and  other Sarbecovirus currently known to infect humans.  If clinically indicated additional testing with an alternate test  methodology [phone]) is advised. The SARS-CoV-2 RNA is generally  detectable in upper and lower respiratory sp ecimens during the acute  phase of infection. The expected result is Negative. Fact Sheet for Patients:  BoilerBrush.com.cy Fact Sheet for Healthcare Providers: https://pope.com/ This test is not yet approved or cleared by the [place] FDA and has been authorized for detection and/or diagnosis of SARS-CoV-2 by FDA under an Emergency Use Authorization (EUA).  This EUA will remain in effect (meaning this test can be used) for the duration of the COVID-19 declaration under Section 564(b)(1) of the Act, 21 U.S.C. section 360bbb-3(b)(1), unless the authorization is terminated or revoked sooner. Performed at [facility], [address] [place], [place] [address]   MRSA PCR Screening     Status: None   Collection Time: 09/02/18  3:40 AM   Specimen: Nasopharyngeal  Result Value Ref Range   MRSA by PCR NEGATIVE NEGATIVE    Comment:        The GeneXpert MRSA Assay (FDA approved for NASAL specimens only), is one component of a comprehensive MRSA colonization surveillance program. It is not intended to diagnose MRSA infection nor to guide or monitor treatment for MRSA infections. Performed at [facility], [address] [place], [place] [address]   Glucose, capillary     Status: None   Collection Time: 09/02/18  3:45 AM  Result Value Ref Range   Glucose-Capillary 71 70 - 99 mg/dL  Basic metabolic panel     Status: Abnormal   Collection Time: 09/02/18  4:29  AM  Result Value Ref Range   Sodium 139 135 - 145 mmol/L   Potassium 4.5 3.5 - 5.1 mmol/L   Chloride 107 98 - 111 mmol/L   CO2 23 22 - 32 mmol/L   Glucose, Bld 79 70 - 99 mg/dL   BUN 10 6 - 20 mg/dL   Creatinine, Ser 1.61  0.44 - 1.00 mg/dL   Calcium 8.1 (L) 8.9 - 10.3 mg/dL   GFR calc non Af Amer >60 >60 mL/min   GFR calc Af Amer >60 >60 mL/min   Anion gap 9 5 - 15    Comment: Performed at [facility], [address] [place], [place] [address]  CBC     Status: Abnormal   Collection Time: 09/02/18  4:29 AM  Result Value Ref Range   WBC 9.7 4.0 - 10.5 K/uL   RBC 2.67 (L) 3.87 - 5.11 MIL/uL   Hemoglobin 7.2 (L) 12.0 - 15.0 g/dL   HCT 54.0 (L) 98.1 - 19.1 %   MCV 99.6 80.0 - 100.0 fL   MCH 27.0 26.0 - 34.0 pg   MCHC 27.1 (L) 30.0 - 36.0 g/dL   RDW 47.8 (H) 29.5 - 62.1 %   Platelets 178 150 - 400 K/uL   nRBC 0.0 0.0 - 0.2 %    Comment: Performed at [facility], [address] [place], [place] [address]  Ferritin     Status: None   Collection Time: 09/02/18  4:29 AM  Result Value Ref Range   Ferritin 32 11 - 307 ng/mL    Comment: Performed at [facility], [address] [place], [place] [address]  Iron and TIBC     Status: None   Collection Time: 09/02/18  4:29 AM  Result Value Ref Range   Iron 89 28 - 170 ug/dL   TIBC 629 528 - 413 ug/dL   Saturation Ratios 20 10.4 - 31.8 %   UIBC 350 ug/dL    Comment: Performed at [facility], [address] [place], [place] [address]  Vitamin B12     Status: None   Collection Time: 09/02/18  4:29 AM  Result Value Ref Range   Vitamin B-12 499 180 - 914 pg/mL    Comment: (NOTE) This assay is not validated for testing neonatal or myeloproliferative syndrome specimens for Vitamin B12 levels. Performed at [facility], [address] [place], [place] [address]   Magnesium     Status: None   Collection Time: 09/02/18  4:29 AM  Result Value Ref Range   Magnesium 2.3 1.7 - 2.4 mg/dL    Comment: Performed at [facility], [address] [place], [place] [address]  Phosphorus     Status: None   Collection Time: 09/02/18  4:29 AM  Result Value Ref Range   Phosphorus 4.2 2.5 - 4.6 mg/dL    Comment:  Performed at [facility], [address] [place], [place] [address]  Ethanol     Status: None   Collection Time: 09/02/18  4:29 AM  Result Value Ref Range   Alcohol, Ethyl (B) <10 <10 mg/dL    Comment: (NOTE) Lowest detectable limit for serum alcohol is 10 mg/dL. For medical purposes only. Performed at [facility], [address] [place], [place] [address]   Salicylate level     Status: None   Collection Time: 09/02/18  4:29 AM  Result Value Ref Range   Salicylate Lvl <7.0 2.8 - 30.0  mg/dL    Comment: Performed at [facility], [address] [place], [place] [address]  Hemoglobin and hematocrit, blood     Status: Abnormal   Collection Time: 09/02/18  5:14 AM  Result Value Ref Range   Hemoglobin 7.1 (L) 12.0 - 15.0 g/dL   HCT 60.4 (L) 54.0 - 98.1 %    Comment: Performed at [facility], [address] [place], [place] [address]  Type and screen [facility]     Status: None   Collection Time: 09/02/18  5:14 AM  Result Value Ref Range   ABO/RH(D) A NEG    Antibody Screen NEG    Sample Expiration      09/05/2018,2359 Performed at [facility], [address] [place], [place] [address]   Hemoglobin and hematocrit, blood     Status: Abnormal   Collection Time: 09/02/18 10:59 AM  Result Value Ref Range   Hemoglobin 7.1 (L) 12.0 - 15.0 g/dL   HCT 21.3 (L) 08.6 - 57.8 %    Comment: Performed at [facility], [address], [place], [place] [address]    Current Facility-Administered Medications  Medication Dose Route Frequency Provider Last Rate Last Dose  . Chlorhexidine Gluconate Cloth 2 % PADS 6 each  6 each Topical Q0600 Eugenie Norrie, NP   6 each at 09/02/18 0345  . [START ON 09/03/2018] FLUoxetine (PROZAC) capsule 40 mg  40 mg Oral Daily Wieting, Richard, MD      . folic acid (FOLVITE) tablet 1 mg  1 mg Oral Daily Alford Highland, MD   1 mg at 09/02/18 0935  . lactated ringers infusion    Intravenous Continuous Vida Rigger, MD      . LORazepam (ATIVAN) tablet 1 mg  1 mg Oral Q6H PRN Alford Highland, MD   1 mg at 09/02/18 0935   Or  . LORazepam (ATIVAN) injection 1 mg  1 mg Intravenous Q6H PRN Wieting, Richard, MD      . multivitamin with minerals tablet 1 tablet  1 tablet Oral Daily Alford Highland, MD   1 tablet at 09/02/18 0935  . naloxone [facility]) 2 MG/2ML injection           . naloxone (NARCAN) 4 mg in dextrose 5 % 250 mL infusion  1 mg/hr Intravenous Continuous Alford Highland, MD 62.5 mL/hr at 09/02/18 0612 1 mg/hr at 09/02/18 0612  . ondansetron (ZOFRAN) tablet 4 mg  4 mg Oral Q6H PRN Wieting, Richard, MD       Or  . ondansetron (ZOFRAN) injection 4 mg  4 mg Intravenous Q6H PRN Wieting, Richard, MD      . thiamine (B-1) injection 100 mg  100 mg Intravenous Daily Alford Highland, MD        Musculoskeletal: Strength & Muscle Tone: decreased Gait & Station: Patient remained sitting in bed during evaluation Patient leans: N/A  Psychiatric Specialty Exam: Physical Exam  Nursing note and vitals reviewed. Constitutional: She is oriented to person, place, and time. She appears well-developed and well-nourished.  Cardiovascular: Normal rate.  Respiratory: Effort normal.  Musculoskeletal: Normal range of motion.  Neurological: She is oriented to person, place, and time.    Review of Systems  Constitutional: Negative.   HENT: Negative.   Eyes: Negative.   Respiratory: Negative.   Cardiovascular: Negative.   Gastrointestinal: Negative.   Genitourinary: Negative.   Musculoskeletal: Negative.   Skin: Negative.   Neurological: Negative.   Endo/Heme/Allergies: Negative.   Psychiatric/Behavioral:  Positive for depression and substance abuse. Negative for hallucinations and suicidal ideas. The patient is nervous/anxious.     Blood pressure 105/63, pulse 84, temperature 98.5 F (36.9 C), resp. rate 15, height 5\' 4"  (1.626 m), weight 67.5 kg, SpO2 99 %.Body mass  index is 25.54 kg/m.  General Appearance: Casual  Eye Contact:  Good  Speech:  Clear and Coherent and Normal Rate  Volume:  Normal  Mood:  Depressed  Affect:  Congruent  Thought Process:  Coherent and Descriptions of Associations: Intact  Orientation:  Full (Time, Place, and Person)  Thought Content:  Logical  Suicidal Thoughts:  No  Homicidal Thoughts:  No  Memory:  Immediate;   Good Recent;   Good Remote;   Good  Judgement:  Fair  Insight:  Fair  Psychomotor Activity:  Normal  Concentration:  Concentration: Good and Attention Span: Good  Recall:  Good  Fund of Knowledge:  Good  Language:  Good  Akathisia:  No  Handed:  Right  AIMS (if indicated):     Assets:  Communication Skills Desire for Improvement Housing Resilience Social Support Transportation  ADL's:  Intact  Cognition:  WNL  Sleep:        Treatment Plan Summary: Daily contact with patient to assess and evaluate symptoms and progress in treatment and Medication management  Continue Prozac 40 mg PO Daily Restart Vistaril 25 mg PO BID PRN for anxiety Social work consult for assistance with establishing a residential rehab [facility] for [facility] health and substance abuse treatment  Disposition: No evidence of imminent risk to self or others at present.   Patient does not meet criteria for psychiatric inpatient admission.  Gerlene Burdockravis B Money, FNP 09/02/2018 12:15 PM

## 2018-09-02 NOTE — Clinical Social Work Note (Signed)
Went by patient's room to discuss substance abuse treatment. Patient was in a deep sleep and did not wake up to [name] calling her name 3 times. Will try again this afternoon or tomorrow morning.  Ellen Smith, [place]

## 2018-09-02 NOTE — ED Notes (Signed)
Patient immediately desats to high 70s on RA. Placed back on 5L, MD aware

## 2018-09-02 NOTE — H&P (Signed)
[facility] [place] NAME: Ellen Smith    MR#:  [id number]  DATE OF BIRTH:  Oct 05, 1975  DATE OF ADMISSION:  09/01/2018  PRIMARY CARE PHYSICIAN: Patient, No Pcp Per   REQUESTING/REFERRING PHYSICIAN: Dr Gonzella Lex  CHIEF COMPLAINT:   Chief Complaint  Patient presents with  . Drug Overdose    HISTORY OF PRESENT ILLNESS:  Ellen Smith  is a 43 y.o. female coming in with altered mental status.  I spoke with the patient's significant other and she stated that the patient is not a drug abuser but does take Percocet.  Apparently she had some heroin also.  The patient's major issue is alcohol abuse.  The patient significant other went out for a few minutes and came back and the patient was face down and gray and she started CPR.  When EMS arrived they gave Narcan and she started coming through.  In the emergency room give another 2 doses of Narcan.  Patient was still hypoxic on 4 L of oxygen.  When I went into the room to evaluate for admission she was less responsive again and needed another dose of Narcan and was started on a Narcan drip.  Patient not able to give any history at this time.  History obtained from old chart and significant other.  PAST MEDICAL HISTORY:   Past Medical History:  Diagnosis Date  . Alcohol abuse     PAST SURGICAL HISTORY:   Past Surgical History:  Procedure Laterality Date  . GASTRIC BYPASS      SOCIAL HISTORY:   Social History   Tobacco Use  . Smoking status: Current Every Day Smoker    Packs/day: 1.00    Years: 20.00    Pack years: 20.00    Types: Cigarettes  . Smokeless tobacco: Never Used  Substance Use Topics  . Alcohol use: Yes    Alcohol/week: 12.0 standard drinks    Types: 12 Cans of beer per week    FAMILY HISTORY:   Family History  Problem Relation Age of Onset  . Hypertension Mother     DRUG ALLERGIES:   Allergies  Allergen Reactions  . Ceclor [Cefaclor] Hives     REVIEW OF SYSTEMS:  Unable to provide review of systems at this time secondary to altered mental status  MEDICATIONS AT HOME:   Prior to Admission medications   Medication Sig Start Date End Date Taking? Authorizing Provider  Aspirin-Acetaminophen-Caffeine (GOODY HEADACHE PO) Take 1 packet by mouth every 4 (four) hours as needed.   Yes [provider]  diphenhydrAMINE (BENADRYL) 25 mg capsule Take 25 mg by mouth every 6 (six) hours as needed for allergies.   Yes [provider]  FLUoxetine (PROZAC) 40 MG capsule Take 40 mg by mouth daily.   Yes [provider]  hydrOXYzine (VISTARIL) 50 MG capsule Take 50 mg by mouth 3 (three) times daily as needed.   Yes [provider]  naloxone [facility]) nasal spray 4 mg/0.1 mL To use in case of opioid overdose and difficulty breathing 09/01/18   Nance Pear, MD      VITAL SIGNS:  Blood pressure 138/85, pulse (!) 121, temperature 98.6 F (37 C), temperature source Oral, resp. rate 17, height 5\' 3"  (1.6 m), weight 67.1 kg, SpO2 96 %.  PHYSICAL EXAMINATION:  GENERAL:  43 y.o.-year-old patient lying in the bed with decreased respirations.  EYES: Pupils equal, round, reactive to light and accommodation. No scleral icterus. Extraocular muscles  intact.  HEENT: Head atraumatic, normocephalic. Oropharynx and nasopharynx clear.  NECK:  Supple, no jugular venous distention. No thyroid enlargement, no tenderness.  LUNGS: Decreased breath sounds bilaterally, no wheezing, rales,rhonchi or crepitation. No use of accessory muscles of respiration.  CARDIOVASCULAR: S1, S2 normal. No murmurs, rubs, or gallops.  ABDOMEN: Soft, nontender, nondistended. Bowel sounds present. No organomegaly or mass.  EXTREMITIES: No pedal edema, cyanosis, or clubbing.  NEUROLOGIC: Cranial nerves II through XII are intact. Muscle strength 5/5 in all extremities. Sensation intact. Gait not checked.  PSYCHIATRIC: The patient is alert and oriented  x 3.  SKIN: No rash, lesion, or ulcer.   LABORATORY PANEL:   CBC Recent Labs  Lab 09/02/18 0027  WBC 18.9*  HGB 8.9*  HCT 32.4*  PLT 294   ------------------------------------------------------------------------------------------------------------------  Chemistries  Recent Labs  Lab 09/02/18 0027  NA 137  K 5.3*  CL 104  CO2 23  GLUCOSE 77  BUN 10  CREATININE 0.65  CALCIUM 8.4*  AST 155*  ALT 54*  ALKPHOS 483*  BILITOT 0.9   ------------------------------------------------------------------------------------------------------------------   RADIOLOGY:  Dg Chest Portable 1 View  Result Date: 09/02/2018 CLINICAL DATA:  Hypoxia history of drug abuse EXAM: PORTABLE CHEST 1 VIEW COMPARISON:  None. FINDINGS: The heart size and mediastinal contours are within normal limits. Both lungs are clear. The visualized skeletal structures are unremarkable. IMPRESSION: No active disease. Electronically Signed   By: Jasmine PangKim  Fujinaga M.D.   On: 09/02/2018 01:03    EKG:   Sinus tachycardia 108 bpm nonspecific ST-T wave changes  IMPRESSION AND PLAN:   1.  Drug overdose with Percocet and heroin.  Since the patient had numerous episodes of decreased responsiveness a Narcan drip was ordered.  Patient will be monitored in the stepdown unit.  Consider psychiatric consultation in the morning. 2.  Acute hypoxic respiratory failure secondary to drug overdose.  Continue oxygen supplementation watch respiratory status closely. 3.  Alcohol abuse.  Put on alcohol withdrawal protocol.  May end up needing a Precedex drip. 4.  Elevated liver function test secondary to alcohol abuse. 5.  Leukocytosis.  Unclear if any infection at this point.  Still awaiting urine analysis.  Chest x-ray negative.  Vomiting can also bring up the white count. 6.  Anemia.  Send off iron studies 7.  Hyperkalemia.  Gentle IV fluid hydration and recheck in the morning.   All the records are reviewed and case discussed with  ED provider. Management plans discussed with significant other and she is in agreement.  CODE STATUS: full code  TOTAL TIME TAKING CARE OF THIS PATIENT: 50 minutes.    Alford Highlandichard Tamaira Ciriello M.D on 09/02/2018 at 1:23 AM  Between 7am to 6pm - Pager - [phone]  After 6pm call admission pager [phone]  [employer] Office  [phone]  CC: Primary care physician; Patient, No Pcp Per

## 2018-09-02 NOTE — ED Notes (Signed)
Patient's fiance notified that patient has bed in ICU and will be admitted as she continues to desat and is dependent on narcan

## 2018-09-02 NOTE — ED Notes (Signed)
Need to wait for results of rapid COVID swab before transfer to ICU

## 2018-09-02 NOTE — ED Notes (Signed)
ED TO INPATIENT HANDOFF REPORT  ED Nurse Name and Phone #: Jae DireKate [phone]  S Name/Age/Gender Ellen Smith 43 y.o. female Room/Bed: ED10A/ED10A  Code Status   Code Status: Full Code  Home/SNF/Other Home Patient oriented to: self, place, time and situation Is this baseline? Yes   Triage Complete: Triage complete  Chief Complaint Overdose  Triage Note As per patient was home doing heroin, xanax, percocet's and drinking with her girlfriend. Had a little to much. Denies SI. Patient unresponsive upon ems arrival, 3mg  intra nasal Narcan given prior to ed arrival. Patient unable to stay awake  Long enough to answer triage questions.    Allergies Allergies  Allergen Reactions  . Ceclor [Cefaclor] Hives    Level of Care/Admitting Diagnosis ED Disposition    ED Disposition Condition Comment   Admit  [facility] [[id number]]  Level of Care: Stepdown [14]  Covid Evaluation: Person Under Investigation (PUI)  Diagnosis: Overdose [202577]  Admitting Physician: Alford HighlandWIETING, RICHARD [[id number]]  Attending Physician: Alford HighlandWIETING, RICHARD [phone][[id number]]  Estimated length of stay: past midnight tomorrow  Certification:: I certify this patient will need inpatient services for at least 2 midnights  PT Class (Do Not Modify): Inpatient [101]  PT Acc Code (Do Not Modify): Private [1]       B Medical/Surgery History Past Medical History:  Diagnosis Date  . Alcohol abuse    Past Surgical History:  Procedure Laterality Date  . GASTRIC BYPASS       A IV Location/Drains/Wounds Patient Lines/Drains/Airways Status   Active Line/Drains/Airways    Name:   Placement date:   Placement time:   Site:   Days:   Peripheral IV 09/01/18 Anterior;Left;Upper Arm   09/01/18    1756    Arm   1   Peripheral IV 09/01/18 Left Wrist   09/01/18    1818    Wrist   1          Intake/Output Last 24 hours No intake or output data in the 24 hours ending 09/02/18 0130  Labs/Imaging Results  for orders placed or performed during the hospital encounter of 09/01/18 (from the past 48 hour(s))  Acetaminophen level     Status: Abnormal   Collection Time: 09/01/18  6:08 PM  Result Value Ref Range   Acetaminophen (Tylenol), Serum <10 (L) 10 - 30 ug/mL    Comment: (NOTE) Therapeutic concentrations vary significantly. A range of 10-30 ug/mL  may be an effective concentration for many patients. However, some  are best treated at concentrations outside of this range. Acetaminophen concentrations >150 ug/mL at 4 hours after ingestion  and >50 ug/mL at 12 hours after ingestion are often associated with  toxic reactions. Performed at [facility], [address] [place], [place] [address]   CBC     Status: Abnormal   Collection Time: 09/02/18 12:27 AM  Result Value Ref Range   WBC 18.9 (H) 4.0 - 10.5 K/uL   RBC 3.28 (L) 3.87 - 5.11 MIL/uL   Hemoglobin 8.9 (L) 12.0 - 15.0 g/dL   HCT 47.832.4 (L) 29.536.0 - 62.146.0 %   MCV 98.8 80.0 - 100.0 fL   MCH 27.1 26.0 - 34.0 pg   MCHC 27.5 (L) 30.0 - 36.0 g/dL   RDW 30.822.1 (H) 65.711.5 - 84.615.5 %   Platelets 294 150 - 400 K/uL   nRBC 0.0 0.0 - 0.2 %    Comment: Performed at [facility], [address] [place], [place] [address]  Comprehensive metabolic panel     Status: Abnormal   Collection Time: 09/02/18 12:27 AM  Result Value Ref Range   Sodium 137 135 - 145 mmol/L   Potassium 5.3 (H) 3.5 - 5.1 mmol/L   Chloride 104 98 - 111 mmol/L   CO2 23 22 - 32 mmol/L   Glucose, Bld 77 70 - 99 mg/dL   BUN 10 6 - 20 mg/dL   Creatinine, Ser 0.65 0.44 - 1.00 mg/dL   Calcium 8.4 (L) 8.9 - 10.3 mg/dL   Total Protein 7.5 6.5 - 8.1 g/dL   Albumin 3.6 3.5 - 5.0 g/dL   AST 155 (H) 15 - 41 U/L   ALT 54 (H) 0 - 44 U/L   Alkaline Phosphatase 483 (H) 38 - 126 U/L   Total Bilirubin 0.9 0.3 - 1.2 mg/dL   GFR calc non Af Amer >60 >60 mL/min   GFR calc Af Amer >60 >60 mL/min   Anion gap 10 5 - 15    Comment: Performed at [facility], [address] [place], [place] [address]  hCG, quantitative, pregnancy     Status: None   Collection Time: 09/02/18 12:27 AM  Result Value Ref Range   hCG, Beta Chain, Quant, S 1 <5 mIU/mL    Comment:          GEST. AGE      CONC.  (mIU/mL)   <=1 WEEK        5 - 50     2 WEEKS       50 - 500     3 WEEKS       100 - 10,000     4 WEEKS     1,000 - 30,000     5 WEEKS     3,500 - 115,000   6-8 WEEKS     12,000 - 270,000    12 WEEKS     15,000 - 220,000        FEMALE AND NON-PREGNANT FEMALE:     LESS THAN 5 mIU/mL Performed at [facility], [address] [place], [place] [address]    Dg Chest Portable 1 View  Result Date: 09/02/2018 CLINICAL DATA:  Hypoxia history of drug abuse EXAM: PORTABLE CHEST 1 VIEW COMPARISON:  None. FINDINGS: The heart size and mediastinal contours are within normal limits. Both lungs are clear. The visualized skeletal structures are unremarkable. IMPRESSION: No active disease. Electronically Signed   By: Donavan Foil M.D.   On: 09/02/2018 01:03    Pending Labs Unresulted Labs (From admission, onward)    Start     Ordered   09/09/18 0500  Creatinine, serum  (enoxaparin (LOVENOX)    CrCl >/= 30 ml/min)  Weekly,   STAT    Comments: while on enoxaparin therapy    09/02/18 0121   09/02/18 8527  Basic metabolic panel  Tomorrow morning,   STAT     09/02/18 0121   09/02/18 0500  CBC  Tomorrow morning,   STAT     09/02/18 0121   09/02/18 0122  SARS Coronavirus 2 (CEPHEID - Performed in [place] hospital lab), Hosp Order  (Asymptomatic Patients Labs)  ONCE - STAT,   STAT    Question:  Rule Out  Answer:  Yes   09/02/18 0121   09/02/18 0121  HIV antibody (Routine Testing)  Add-on,   AD     09/02/18 0121   09/02/18 0116  Urine Drug Screen, Qualitative ([place] only)  Once,   STAT     09/02/18 0115          Vitals/Pain Today's Vitals   09/02/18 0000 09/02/18 0002 09/02/18 0013 09/02/18 0030  BP: 122/83   138/85  Pulse: (!) 104  (!) 112 (!) 121   Resp: (!) 21  13 17   Temp:      TempSrc:      SpO2: 99%  91% 96%  Weight:      Height:      PainSc:  Asleep      Isolation Precautions No active isolations  Medications Medications  naloxone (NARCAN) 2 MG/2ML injection (  Canceled Entry 09/02/18 0114)  naloxone HCl (NARCAN) 4 mg in dextrose 5 % 250 mL infusion (has no administration in time range)  LORazepam (ATIVAN) tablet 1 mg (has no administration in time range)    Or  LORazepam (ATIVAN) injection 1 mg (has no administration in time range)  thiamine (B-1) injection 100 mg (has no administration in time range)  folic acid (FOLVITE) tablet 1 mg (has no administration in time range)  multivitamin with minerals tablet 1 tablet (has no administration in time range)  enoxaparin (LOVENOX) injection 40 mg (has no administration in time range)  ondansetron (ZOFRAN) tablet 4 mg (has no administration in time range)    Or  ondansetron (ZOFRAN) injection 4 mg (has no administration in time range)  0.9 %  sodium chloride infusion (has no administration in time range)  FLUoxetine (PROZAC) capsule 40 mg (has no administration in time range)  naloxone [facility](NARCAN) injection 1 mg (1 mg Intravenous Given 09/01/18 1916)  naloxone (NARCAN) 2 MG/2ML injection (  Given 09/01/18 2303)  naloxone [facility](NARCAN) injection 2 mg (2 mg Intravenous Given 09/02/18 0113)  ondansetron (ZOFRAN) injection 4 mg (4 mg Intravenous Given 09/02/18 0113)  haloperidol lactate (HALDOL) injection 2 mg (2 mg Intravenous Given 09/02/18 0128)    Mobility walks with person assist High fall risk   Focused Assessments Pulmonary Assessment Handoff:  Lung sounds:   O2 Device: Simple Mask O2 Flow Rate (L/min): 5 L/min      R Recommendations: See Admitting Provider Note  Report given to:   Additional Notes:

## 2018-09-02 NOTE — Progress Notes (Signed)
eLink Physician-Brief Progress Note Patient Name: Ellen Smith DOB: [birth year] MRN: [id number]   Date of Service  09/02/2018  HPI/Events of Note  Pt admitted following a narcotic overdose involving percocet and Heroin. Pt also has a hx of ETOH abuse.  eICU Interventions  New patient evaluation completed        Frederik Pear 09/02/2018, 3:49 AM

## 2018-09-02 NOTE — ED Provider Notes (Addendum)
-----------------------------------------   1:14 AM on 09/02/2018 -----------------------------------------   Blood pressure 138/85, pulse (!) 121, temperature 98.6 F (37 C), temperature source Oral, resp. rate 17, height 5\' 3"  (1.6 m), weight 67.1 kg, SpO2 96 %.  Assuming care from Dr. Archie Balboa of [facility] Must[facility]ha is a 43 y.o. female with a chief complaint of Drug Overdose .    Please refer to H&P by previous MD for further details.  Patient presents with unintentional overdose of narcotics.  Received 3 rounds of Narcan per EMS and 2 here with improvement of her mental status and respiratory depression.  Patient reevaluated by myself and continued to have persistent hypoxia even though mental status was improved therefore decision was made to admit to Dr. Leslye Peer.  30 minutes later patient was found to have declining mental status again requiring a 6 dose of Narcan.  Patient will not be placed on a Narcan drip and be admitted to the ICU.   CRITICAL CARE Performed by: Rudene Re  ?  Total critical care time: 35 min  Critical care time was exclusive of separately billable procedures and treating other patients.  Critical care was necessary to treat or prevent imminent or life-threatening deterioration.  Critical care was time spent personally by me on the following activities: development of treatment plan with patient and/or surrogate as well as nursing, discussions with consultants, evaluation of patient's response to treatment, examination of patient, obtaining history from patient or surrogate, ordering and performing treatments and interventions, ordering and review of laboratory studies, ordering and review of radiographic studies, pulse oximetry and re-evaluation of patient's condition.   ED ECG REPORT I, Rudene Re, the attending physician, personally viewed and interpreted this ECG.  Sinus tachycardia, rate of 114, normal intervals, normal axis, no ST elevations or  depressions.      Alfred Levins, [place], MD 09/02/18 Coleman, Fate, MD 09/02/18 [phone]

## 2018-09-03 ENCOUNTER — Inpatient Hospital Stay: Payer: Self-pay

## 2018-09-03 LAB — CBC
HCT: 25 % — ABNORMAL LOW (ref 36.0–46.0)
HCT: 29.8 % — ABNORMAL LOW (ref 36.0–46.0)
Hemoglobin: 6.9 g/dL — ABNORMAL LOW (ref 12.0–15.0)
Hemoglobin: 8.8 g/dL — ABNORMAL LOW (ref 12.0–15.0)
MCH: 27.2 pg (ref 26.0–34.0)
MCH: 27.9 pg (ref 26.0–34.0)
MCHC: 27.6 g/dL — ABNORMAL LOW (ref 30.0–36.0)
MCHC: 29.5 g/dL — ABNORMAL LOW (ref 30.0–36.0)
MCV: 98.4 fL (ref 80.0–100.0)
MCV: 94.6 fL (ref 80.0–100.0)
Platelets: 163 10*3/uL (ref 150–400)
Platelets: 168 10*3/uL (ref 150–400)
RBC: 2.54 MIL/uL — ABNORMAL LOW (ref 3.87–5.11)
RBC: 3.15 MIL/uL — ABNORMAL LOW (ref 3.87–5.11)
RDW: 21.1 % — ABNORMAL HIGH (ref 11.5–15.5)
RDW: 20.2 % — ABNORMAL HIGH (ref 11.5–15.5)
WBC: 5.2 10*3/uL (ref 4.0–10.5)
WBC: 7.8 10*3/uL (ref 4.0–10.5)
nRBC: 0 % (ref 0.0–0.2)
nRBC: 0 % (ref 0.0–0.2)

## 2018-09-03 LAB — URINE DRUG SCREEN, QUALITATIVE (ARMC ONLY)
Amphetamines, Ur Screen: NOT DETECTED
Barbiturates, Ur Screen: NOT DETECTED
Benzodiazepine, Ur Scrn: POSITIVE — AB
Cannabinoid 50 Ng, Ur ~~LOC~~: NOT DETECTED
Cocaine Metabolite,Ur ~~LOC~~: NOT DETECTED
MDMA (Ecstasy)Ur Screen: NOT DETECTED
Methadone Scn, Ur: NOT DETECTED
Opiate, Ur Screen: NOT DETECTED
Phencyclidine (PCP) Ur S: NOT DETECTED
Tricyclic, Ur Screen: NOT DETECTED

## 2018-09-03 LAB — NOVEL CORONAVIRUS, NAA (HOSP ORDER, SEND-OUT TO REF LAB; TAT 18-24 HRS): SARS-CoV-2, NAA: NOT DETECTED

## 2018-09-03 LAB — HEPATIC FUNCTION PANEL
ALT: 41 U/L (ref 0–44)
AST: 80 U/L — ABNORMAL HIGH (ref 15–41)
Albumin: 3.1 g/dL — ABNORMAL LOW (ref 3.5–5.0)
Alkaline Phosphatase: 325 U/L — ABNORMAL HIGH (ref 38–126)
Bilirubin, Direct: 0.2 mg/dL (ref 0.0–0.2)
Indirect Bilirubin: 0.3 mg/dL (ref 0.3–0.9)
Total Bilirubin: 0.5 mg/dL (ref 0.3–1.2)
Total Protein: 6.8 g/dL (ref 6.5–8.1)

## 2018-09-03 LAB — PROTIME-INR
INR: 1 (ref 0.8–1.2)
Prothrombin Time: 12.6 seconds (ref 11.4–15.2)

## 2018-09-03 LAB — BASIC METABOLIC PANEL
Anion gap: 6 (ref 5–15)
BUN: <5 mg/dL — ABNORMAL LOW (ref 6–20)
CO2: 27 mmol/L (ref 22–32)
Calcium: 8.5 mg/dL — ABNORMAL LOW (ref 8.9–10.3)
Chloride: 100 mmol/L (ref 98–111)
Creatinine, Ser: <0.3 mg/dL — ABNORMAL LOW (ref 0.44–1.00)
Glucose, Bld: 92 mg/dL (ref 70–99)
Potassium: 4.2 mmol/L (ref 3.5–5.1)
Sodium: 133 mmol/L — ABNORMAL LOW (ref 135–145)

## 2018-09-03 LAB — BLOOD GAS, ARTERIAL
Acid-Base Excess: 1.8 mmol/L (ref 0.0–2.0)
Bicarbonate: 28.6 mmol/L — ABNORMAL HIGH (ref 20.0–28.0)
FIO2: 0.21
O2 Saturation: 88.9 %
Patient temperature: 37
pCO2 arterial: 53 mmHg — ABNORMAL HIGH (ref 32.0–48.0)
pH, Arterial: 7.34 — ABNORMAL LOW (ref 7.350–7.450)
pO2, Arterial: 60 mmHg — ABNORMAL LOW (ref 83.0–108.0)

## 2018-09-03 LAB — GLUCOSE, CAPILLARY
Glucose-Capillary: 80 mg/dL (ref 70–99)
Glucose-Capillary: 85 mg/dL (ref 70–99)
Glucose-Capillary: 93 mg/dL (ref 70–99)
Glucose-Capillary: 97 mg/dL (ref 70–99)

## 2018-09-03 LAB — ABO/RH
ABO/RH(D): A POS
DAT, IgG: NEGATIVE
Weak D: POSITIVE

## 2018-09-03 LAB — APTT: aPTT: 28 seconds (ref 24–36)

## 2018-09-03 LAB — PREPARE RBC (CROSSMATCH)

## 2018-09-03 LAB — PHOSPHORUS: Phosphorus: 1.9 mg/dL — ABNORMAL LOW (ref 2.5–4.6)

## 2018-09-03 LAB — LITHIUM LEVEL: Lithium Lvl: <0.06 mmol/L — ABNORMAL LOW (ref 0.60–1.20)

## 2018-09-03 LAB — HIV ANTIBODY (ROUTINE TESTING W REFLEX): HIV Screen 4th Generation wRfx: NONREACTIVE

## 2018-09-03 LAB — MAGNESIUM: Magnesium: 2.1 mg/dL (ref 1.7–2.4)

## 2018-09-03 MED ORDER — PANTOPRAZOLE SODIUM 40 MG IV SOLR
40.0000 mg | Freq: Two times a day (BID) | INTRAVENOUS | Status: DC
  Administered 2018-09-03 – 2018-09-04 (×3): 40 mg via INTRAVENOUS
  Filled 2018-09-03 (×3): qty 40

## 2018-09-03 MED ORDER — SODIUM CHLORIDE 0.9% IV SOLUTION: Freq: Once | INTRAVENOUS | Status: DC

## 2018-09-03 MED ORDER — SODIUM PHOSPHATES 45 MMOLE/15ML IV SOLN
30.0000 mmol | Freq: Once | INTRAVENOUS | Status: AC
  Administered 2018-09-03: 14:00:00 30 mmol via INTRAVENOUS
  Filled 2018-09-03: qty 10

## 2018-09-03 MED ORDER — VITAMIN B-1 100 MG PO TABS
100.0000 mg | ORAL_TABLET | Freq: Every day | ORAL | Status: DC
  Administered 2018-09-03 – 2018-09-04 (×2): 100 mg via ORAL
  Filled 2018-09-03 (×2): qty 1

## 2018-09-03 NOTE — Progress Notes (Signed)
CRITICAL CARE PROGRESS NOTE    Name: Annett Fabianara Witham MRN: [id number] DOB: 08/19/1975     LOS: 1   SUBJECTIVE FINDINGS & SIGNIFICANT EVENTS   Patient description: 43 yo with MDD , drug and alcohol abuse hx came in obtunded hypoxemic s/p drug/alcohol overdose   Lines / Drains: PIV  Cultures / Sepsis markers: MRSA nasal normal COVID negative  Antibiotics: None   Tests / Events: HCV and repeat Urine drug test  Overnight: Patient continues to have desaturation when weaning norcan   PAST MEDICAL HISTORY   Past Medical History:  Diagnosis Date  . Alcohol abuse      SURGICAL HISTORY   Past Surgical History:  Procedure Laterality Date  . GASTRIC BYPASS       FAMILY HISTORY   Family History  Problem Relation Age of Onset  . Hypertension Mother      SOCIAL HISTORY   Social History   Tobacco Use  . Smoking status: Current Every Day Smoker    Packs/day: 1.00    Years: 20.00    Pack years: 20.00    Types: Cigarettes  . Smokeless tobacco: Never Used  Substance Use Topics  . Alcohol use: Yes    Alcohol/week: 12.0 standard drinks    Types: 12 Cans of beer per week  . Drug use: Not on file     MEDICATIONS   Current Medication:  Current Facility-Administered Medications:  .  0.9 %  sodium chloride infusion (Manually program via Guardrails IV Fluids), , Intravenous, Once, Judithe ModestKeene, Jeremiah D, NP .  Chlorhexidine Gluconate Cloth 2 % PADS 6 each, 6 each, Topical, Q0600, Eugenie NorrieBlakeney, Dana G, NP, 6 each at 09/03/18 0636 .  famotidine (PEPCID) tablet 20 mg, 20 mg, Oral, BID, Nasra Counce, MD, 20 mg at 09/02/18 2136 .  FLUoxetine (PROZAC) capsule 40 mg, 40 mg, Oral, Daily, Wieting, Richard, MD .  folic acid (FOLVITE) tablet 1 mg, 1 mg, Oral, Daily, Wieting, Richard, MD, 1 mg at 09/02/18 0935 .   hydrOXYzine (ATARAX/VISTARIL) tablet 25 mg, 25 mg, Oral, BID PRN, Money, Gerlene Burdockravis B, FNP .  lactated ringers infusion, , Intravenous, Continuous, Vida RiggerAleskerov, Karleigh Bunte, MD, Last Rate: 75 mL/hr at 09/03/18 0500 .  LORazepam (ATIVAN) tablet 1 mg, 1 mg, Oral, Q6H PRN, 1 mg at 09/03/18 0204 **OR** LORazepam (ATIVAN) injection 1 mg, 1 mg, Intravenous, Q6H PRN, Wieting, Richard, MD .  multivitamin with minerals tablet 1 tablet, 1 tablet, Oral, Daily, Wieting, Richard, MD, 1 tablet at 09/02/18 0935 .  naloxone (NARCAN) 4 mg in dextrose 5 % 250 mL infusion, 0.25 mg/hr, Intravenous, Continuous, Harlon DittyKeene, Jeremiah D, NP, Last Rate: 7.5 mL/hr at 09/03/18 0629, 0.12 mg/hr at 09/03/18 0629 .  nicotine (NICODERM CQ - dosed in mg/24 hours) patch 21 mg, 21 mg, Transdermal, Daily, Harlon DittyKeene, Jeremiah D, NP, 21 mg at 09/02/18 2136 .  ondansetron (ZOFRAN) tablet 4 mg, 4 mg, Oral, Q6H PRN **OR** ondansetron (ZOFRAN) injection 4 mg, 4 mg, Intravenous, Q6H PRN, Wieting, Richard, MD .  pantoprazole (PROTONIX) injection 40 mg, 40 mg, Intravenous, Q12H, Harlon DittyKeene, Jeremiah D, NP, 40 mg at 09/03/18 [id number] .  thiamine (B-1) injection 100 mg, 100 mg, Intravenous, Daily, Wieting, Richard, MD    ALLERGIES   Ceclor [cefaclor]    REVIEW OF SYSTEMS    10 point ros not done due to confusion   PHYSICAL EXAMINATION   Vital Signs: Temp:  [98.3 F (36.8 C)-98.9 F (37.2 C)] 98.7 F (37.1 C) (07/30 0633) Pulse Rate:  [79-112] 85 (  07/30 6962) Resp:  [11-22] 12 (07/30 9528) BP: (105-144)/(54-88) 133/88 (07/30 4132) SpO2:  [94 %-100 %] 96 % (07/30 4401)  GENERAL:nad  HEAD: Normocephalic, atraumatic.  EYES: Pupils equal, round, reactive to light.  No scleral icterus.  MOUTH: Moist mucosal membrane. NECK: Supple. No thyromegaly. No nodules. No JVD.  PULMONARY: mild crackles  CARDIOVASCULAR: S1 and S2. Regular rate and rhythm. No murmurs, rubs, or gallops.  GASTROINTESTINAL: Soft, nontender, non-distended. No masses. Positive bowel sounds.  No hepatosplenomegaly.  MUSCULOSKELETAL: No swelling, clubbing, or edema.  NEUROLOGIC: Mild distress due to acute illness SKIN:intact,warm,dry   PERTINENT DATA     Infusions: . lactated ringers 75 mL/hr at 09/03/18 0500  . naLOXone [facility]) adult infusion for OVERDOSE 0.12 mg/hr (09/03/18 0629)   Scheduled Medications: . sodium chloride   Intravenous Once  . Chlorhexidine Gluconate Cloth  6 each Topical Q0600  . famotidine  20 mg Oral BID  . FLUoxetine  40 mg Oral Daily  . folic acid  1 mg Oral Daily  . multivitamin with minerals  1 tablet Oral Daily  . nicotine  21 mg Transdermal Daily  . pantoprazole (PROTONIX) IV  40 mg Intravenous Q12H  . thiamine  100 mg Intravenous Daily   PRN Medications: hydrOXYzine, LORazepam **OR** LORazepam, ondansetron **OR** ondansetron (ZOFRAN) IV Hemodynamic parameters:   Intake/Output: 07/29 0701 - 07/30 0700 In: 2768.5 [P.O.:720; I.V.:2048.5] Out: -   Ventilator  Settings:       LAB RESULTS:  Basic Metabolic Panel: Recent Labs  Lab 09/02/18 0027 09/02/18 0429 09/03/18 0420  NA 137 139 133*  K 5.3* 4.5 4.2  CL 104 107 100  CO2 23 23 27   GLUCOSE 77 79 92  BUN 10 10 <5*  CREATININE 0.65 0.48 <0.30*  CALCIUM 8.4* 8.1* 8.5*  MG  --  2.3 2.1  PHOS  --  4.2 1.9*   Liver Function Tests: Recent Labs  Lab 09/02/18 0027  AST 155*  ALT 54*  ALKPHOS 483*  BILITOT 0.9  PROT 7.5  ALBUMIN 3.6   No results for input(s): LIPASE, AMYLASE in the last 168 hours. No results for input(s): AMMONIA in the last 168 hours. CBC: Recent Labs  Lab 09/02/18 0027 09/02/18 0429 09/02/18 0514 09/02/18 1059 09/03/18 0420  WBC 18.9* 9.7  --   --  5.2  HGB 8.9* 7.2* 7.1* 7.1* 6.9*  HCT 32.4* 26.6* 26.1* 26.0* 25.0*  MCV 98.8 99.6  --   --  98.4  PLT 294 178  --   --  163   Cardiac Enzymes: No results for input(s): CKTOTAL, CKMB, CKMBINDEX, TROPONINI in the last 168 hours. BNP: Invalid input(s): POCBNP CBG: Recent Labs  Lab  09/02/18 0345 09/02/18 1221 09/02/18 1807 09/02/18 1953 09/03/18 0403  GLUCAP 71 97 94 91 93     IMAGING RESULTS:  Imaging: Dg Chest Portable 1 View  Result Date: 09/02/2018 CLINICAL DATA:  Hypoxia history of drug abuse EXAM: PORTABLE CHEST 1 VIEW COMPARISON:  None. FINDINGS: The heart size and mediastinal contours are within normal limits. Both lungs are clear. The visualized skeletal structures are unremarkable. IMPRESSION: No active disease. Electronically Signed   By: Donavan Foil M.D.   On: 09/02/2018 01:03   @PROBHOSP @   ASSESSMENT AND PLAN    -Multidisciplinary rounds held today  Acute Hypoxic Respiratory Failure -due to acute drug overdose -improved with norcan -Wean Fio2 and PEEP as tolerated -repeating drug screen     Severe anemia   - s/p 1unit prbc  transfusion   - HCV ab  - no signs of bleeding on exam   GI/Nutrition GI PROPHYLAXIS as indicated DIET-->TF's as tolerated Constipation protocol as indicated  ENDO - ICU hypoglycemic\Hyperglycemia protocol -check FSBS per protocol   ELECTROLYTES -follow labs as needed -replace as needed -pharmacy consultation   DVT/GI PRX ordered -SCDs  TRANSFUSIONS AS NEEDED MONITOR FSBS ASSESS the need for LABS as needed   Critical care provider statement:    Critical care time (minutes):  32   Critical care time was exclusive of:  Separately billable procedures and treating other patients   Critical care was necessary to treat or prevent imminent or life-threatening deterioration of the following conditions:  acute drug overdose, severe anemia, MDD, altered mental status with confusion.    Critical care was time spent personally by me on the following activities:  Development of treatment plan with patient or surrogate, discussions with consultants, evaluation of patient's response to treatment, examination of patient, obtaining history from patient or surrogate, ordering and performing treatments and  interventions, ordering and review of laboratory studies and re-evaluation of patient's condition.  I assumed direction of critical care for this patient from another provider in my specialty: no    This document was prepared using Dragon voice recognition software and may include unintentional dictation errors.    Vida RiggerFuad Oliva Montecalvo, M.D.  Division of Pulmonary & Critical Care Medicine  [facility]

## 2018-09-03 NOTE — Progress Notes (Signed)
[employer] - [place] [facility]   PATIENT NAME: Ellen Smith    MR#:  [id number]  DATE OF BIRTH:  03/05/1975  SUBJECTIVE:  CHIEF COMPLAINT:   Chief Complaint  Patient presents with  . Drug Overdose   Awake today.  Still needing intermittent Narcan drip due to drowsiness which improved with a drip.  REVIEW OF SYSTEMS:    Review of Systems  Constitutional: Positive for malaise/fatigue. Negative for chills and fever.  HENT: Negative for sore throat.   Eyes: Negative for blurred vision, double vision and pain.  Respiratory: Negative for cough, hemoptysis, shortness of breath and wheezing.   Cardiovascular: Negative for chest pain, palpitations, orthopnea and leg swelling.  Gastrointestinal: Negative for abdominal pain, constipation, diarrhea, heartburn, nausea and vomiting.  Genitourinary: Negative for dysuria and hematuria.  Musculoskeletal: Negative for back pain and joint pain.  Skin: Negative for rash.  Neurological: Positive for headaches. Negative for sensory change, speech change and focal weakness.  Endo/Heme/Allergies: Does not bruise/bleed easily.  Psychiatric/Behavioral: Negative for depression. The patient is not nervous/anxious.     DRUG ALLERGIES:   Allergies  Allergen Reactions  . Ceclor [Cefaclor] Hives    VITALS:  Blood pressure (!) 162/86, pulse 85, temperature 98.7 F (37.1 C), temperature source Oral, resp. rate 18, height 5\' 4"  (1.626 m), weight 67.5 kg, SpO2 96 %.  PHYSICAL EXAMINATION:   Physical Exam  GENERAL:  43 y.o.-year-old patient lying in the bed with no acute distress.  EYES: Pupils equal, round, reactive to light and accommodation. No scleral icterus. Extraocular muscles intact.  HEENT: Head atraumatic, normocephalic. Oropharynx and nasopharynx clear.  NECK:  Supple, no jugular venous distention. No thyroid enlargement, no tenderness.  LUNGS: Normal breath sounds bilaterally, no wheezing, rales, rhonchi. No use of  accessory muscles of respiration.  CARDIOVASCULAR: S1, S2 normal. No murmurs, rubs, or gallops.  ABDOMEN: Soft, nontender, nondistended. Bowel sounds present. No organomegaly or mass.  EXTREMITIES: No cyanosis, clubbing or edema b/l.    NEUROLOGIC: Cranial nerves II through XII are intact. No focal Motor or sensory deficits b/l.   PSYCHIATRIC: The patient is alert and oriented x 3.  SKIN: No obvious rash, lesion, or ulcer.   LABORATORY PANEL:   CBC Recent Labs  Lab 09/03/18 0420  WBC 5.2  HGB 6.9*  HCT 25.0*  PLT 163   ------------------------------------------------------------------------------------------------------------------ Chemistries  Recent Labs  Lab 09/03/18 0420 09/03/18 1000  NA 133*  --   K 4.2  --   CL 100  --   CO2 27  --   GLUCOSE 92  --   BUN <5*  --   CREATININE <0.30*  --   CALCIUM 8.5*  --   MG 2.1  --   AST  --  80*  ALT  --  41  ALKPHOS  --  325*  BILITOT  --  0.5   ------------------------------------------------------------------------------------------------------------------  Cardiac Enzymes No results for input(s): TROPONINI in the last 168 hours. ------------------------------------------------------------------------------------------------------------------  RADIOLOGY:  Dg Chest Portable 1 View  Result Date: 09/02/2018 CLINICAL DATA:  Hypoxia history of drug abuse EXAM: PORTABLE CHEST 1 VIEW COMPARISON:  None. FINDINGS: The heart size and mediastinal contours are within normal limits. Both lungs are clear. The visualized skeletal structures are unremarkable. IMPRESSION: No active disease. Electronically Signed   By: Jasmine PangKim  Fujinaga M.D.   On: 09/02/2018 01:03     ASSESSMENT AND PLAN:   1.  Drug overdose with Percocet and heroin.   Continue Narcan drip  Patient will be monitored in the stepdown unit.  Appreciate input by psychiatry team.  Patient denied intentional use.  Was recreational.  Will need rehabilitation.  2.  Acute  hypoxic respiratory failure secondary to drug overdose.  Continue oxygen supplementation prn  3.  Alcohol abuse.   CIWA protocol  4.  Elevated liver function test secondary to alcohol abuse.  5.  Leukocytosis.  Resolved  6.  Chronic anemia Slowly worsening. Likely dilutional. No bleeding  7.  Hyperkalemia.  Resolved  All the records are reviewed and case discussed with Care Management/Social Worker Management plans discussed with the patient, family and they are in agreement.  CODE STATUS: FULL CODE  DVT Prophylaxis: SCDs  TOTAL TIME TAKING CARE OF THIS PATIENT: 30 minutes.   POSSIBLE D/C IN 1-2 DAYS, DEPENDING ON CLINICAL CONDITION.  Leia Alf Ranyia Witting M.D on 09/03/2018 at 12:34 PM  Between 7am to 6pm - Pager - [phone]  After 6pm go to [email or web address] - password [email or web address] [facility]  Office  [phone]  CC: Primary care physician; Patient, No Pcp Per  Note: This dictation was prepared with Dragon dictation along with smaller phrase technology. Any transcriptional errors that result from this process are unintentional.

## 2018-09-03 NOTE — Consult Note (Signed)
[facility] Consult   Reason for Consult:  Unintentional overdose Referring Physician:  Hospitalist Patient Identification: Ellen Smith MRN:  [id number] Principal Diagnosis: <principal problem not specified> Diagnosis:  Active Problems:   Overdose   Alcohol dependence (HCC)   Opiate abuse, episodic (HCC)   MDD (major depressive disorder), severe (HCC)   Total Time spent with patient: 45 minutes  Subjective:   Ellen Smith is a 43 y.o. female  09/03/18: Patient reports that she is feeling much much better.  She reports that she realizes that she must go to a residential rehab after discharge.  She states that she has been provided with the list of reassured her that her own places that she can go.  She presents a list to me and states that she is [place] and points to [facility] she denies any suicidal or homicidal ideations and denies any hallucinations.  She reports that she spoke to her fianc and they are coming up with a good safety plan for when she discharges home and to settle some finances before she goes to the residential rehab.  09/02/18: patient reports that she is feeling better today.  Patient states that yesterday she was using some Percocet and heroin and only took about 10 mg of Percocet.  She states that evidently that was a little too much for her and that is when she blacked out.  She does report excessive alcohol consumption.  She states that on average she drinks about 10-15 beers a day.  She states that she has done this heavily for the last 5 years.  She states that [facility] and then remained sober for 1 year afterwards.  She reports that she does there was some depression and anxiety and that she is followed by [facility].  She states that she missed her last appointment and ran out of her Prozac approximately 2 days ago.  She states that she is on Prozac 40 mg a day and Vistaril 25 mg twice a day as  needed.  She denies any suicidal or homicidal ideations and denies any hallucinations.  She continues to state that the overdose was never intentional and that this was coming plate asked that an overuse of these drugs.  She also admits that these drugs are not prescribed to her and that she does buy them off the street.  Patient's fianc, Talmage NapJennifer Dawn Smith who goes by [facility], was contacted for collateral information at [phone].  She reports that this was not an intentional suicide attempt and there is been no mention of having suicidal thoughts.  She states that she does use the opiates from time to time but is not daily.  She states that she is very concerned about her alcohol consumption because it has been excessive for quite a while now.  She agrees that she uses 10-15 beers a day but states that she does have days where she drinks more than that.  She states that she has no concerns with her coming home as far as safety except for her alcohol consumption.  She states that she does feel that she needs to go to a residential rehab again so that she can get sober.  She states that she will talk to the patient to discuss with her about going to a residential rehab so that she can get the help she needs.  HPI:  43 y.o. female coming in with altered  mental status.  I spoke with the patient's significant other and she stated that the patient is not a drug abuser but does take Percocet.  Apparently she had some heroin also.  The patient's major issue is alcohol abuse.  The patient significant other went out for a few minutes and came back and the patient was face down and gray and she started CPR.  When EMS arrived they gave Narcan and she started coming through.  In the emergency room give another 2 doses of Narcan.  Patient was still hypoxic on 4 L of oxygen.  When I went into the room to evaluate for admission she was less responsive again and needed another dose of Narcan and was started on a Narcan drip.   Patient not able to give any history at this time.  History obtained from old chart and significant other.  09/03/18: Patient is seen by this provider face-to-face.  Patient is still sitting on the edge of bed today, however patient is much more alert and talkative.  Patient continues to deny any suicidal homicidal ideations and denies any hallucinations.  She states that she was hoping to have gotten discharged today but found out that she needed to get some blood.  Patient has been provided with resources for residential rehab after she discharges.  Patient also has a good support system at home.  Patient still does not meet any inpatient criteria and is psychiatrically cleared.  I have contacted Dr. Elpidio Anis and notified him of the recommendations.  At this time psychiatry will sign off please reconsult if any further assistance is needed.  09/02/18: Patient is seen by this provider via face-to-face.  Patient is sitting on the edge of the bed.  Patient is now breathing on room air and oxygen saturation was in the mid 90s, heart rate was WNL, and her blood pressure was WNL.  Patient has continuously denied this being a suicide attempt and that it was an unintentional overdose.  She is was discussing about going back to a residential rehab and at first she refused and then stated that she realizes that that would be the best option for her.  Patient does have a good support system with her fianc and her fianc is going to encourage the patient to go to a residential rehab.  I have contacted the social work for this unit and discussed with him the plan to assist the patient with residential rehab if possible.  I have sent Dr. Elpidio Anis a secure chat with the recommendations.  At this time the patient does not meet inpatient criteria and is psychiatric cleared.  Past Psychiatric History: depression, anxiety, alcohol dependence, no hospitalizations, one residential rehab in 2016, opiate use  Risk to Self:   Risk  to Others:   Prior Inpatient Therapy:   Prior Outpatient Therapy:    Past Medical History:  Past Medical History:  Diagnosis Date  . Alcohol abuse     Past Surgical History:  Procedure Laterality Date  . GASTRIC BYPASS     Family History:  Family History  Problem Relation Age of Onset  . Hypertension Mother    Family Psychiatric  History: Denies Social History:  Social History   Substance and Sexual Activity  Alcohol Use Yes  . Alcohol/week: 12.0 standard drinks  . Types: 12 Cans of beer per week     Social History   Substance and Sexual Activity  Drug Use Not on file    Social History  Socioeconomic History  . Marital status: Single    Spouse name: Not on file  . Number of children: Not on file  . Years of education: Not on file  . Highest education level: Not on file  Occupational History  . Not on file  Social Needs  . Financial resource strain: Not on file  . Food insecurity    Worry: Not on file    Inability: Not on file  . Transportation needs    Medical: Not on file    Non-medical: Not on file  Tobacco Use  . Smoking status: Current Every Day Smoker    Packs/day: 1.00    Years: 20.00    Pack years: 20.00    Types: Cigarettes  . Smokeless tobacco: Never Used  Substance and Sexual Activity  . Alcohol use: Yes    Alcohol/week: 12.0 standard drinks    Types: 12 Cans of beer per week  . Drug use: Not on file  . Sexual activity: Not on file  Lifestyle  . Physical activity    Days per week: Not on file    Minutes per session: Not on file  . Stress: Not on file  Relationships  . Social Herbalist on phone: Not on file    Gets together: Not on file    Attends religious service: Not on file    Active member of club or organization: Not on file    Attends meetings of clubs or organizations: Not on file    Relationship status: Not on file  Other Topics Concern  . Not on file  Social History Narrative  . Not on file   Additional  Social History:    Allergies:   Allergies  Allergen Reactions  . Cefaclor Hives    Labs:  Results for orders placed or performed during the hospital encounter of 09/01/18 (from the past 48 hour(s))  Acetaminophen level     Status: Abnormal   Collection Time: 09/01/18  6:08 PM  Result Value Ref Range   Acetaminophen (Tylenol), Serum <10 (L) 10 - 30 ug/mL    Comment: (NOTE) Therapeutic concentrations vary significantly. A range of 10-30 ug/mL  may be an effective concentration for many patients. However, some  are best treated at concentrations outside of this range. Acetaminophen concentrations >150 ug/mL at 4 hours after ingestion  and >50 ug/mL at 12 hours after ingestion are often associated with  toxic reactions. Performed at [facility], [address] [place], [place] [address]   Novel Coronavirus,NAA,(SEND-OUT TO REF LAB - TAT 24-48 hrs); Hosp Order     Status: None   Collection Time: 09/02/18 12:27 AM   Specimen: Nasopharyngeal Swab; Respiratory  Result Value Ref Range   SARS-CoV-2, NAA NOT DETECTED NOT DETECTED    Comment: (NOTE) This test was developed and its performance characteristics determined by [employer]. This test has not been FDA cleared or approved. This test has been authorized by FDA under an Emergency Use Authorization (EUA). This test is only authorized for the duration of time the declaration that circumstances exist justifying the authorization of the emergency use of in vitro diagnostic tests for detection of SARS-CoV-2 virus and/or diagnosis of COVID-19 infection under section 564(b)(1) of the Act, 21 U.S.C. 810FBP-1(W)(2), unless the authorization is terminated or revoked sooner. When diagnostic testing is negative, the possibility of a false negative result should be considered in the context of a patient's recent exposures and the presence of clinical signs and  symptoms consistent with COVID-19. An individual without  symptoms of COVID-19 and who is not shedding SARS-CoV-2 virus would expect to have a negative (not detected) result in this assay. Performed  At: [facility] [address] [place], [place] [id number] Jolene Schimke MD WU:[id number]    Coronavirus Source NASOPHARYNGEAL     Comment: Performed at [facility], [address] [place], [place] [address]  CBC     Status: Abnormal   Collection Time: 09/02/18 12:27 AM  Result Value Ref Range   WBC 18.9 (H) 4.0 - 10.5 K/uL   RBC 3.28 (L) 3.87 - 5.11 MIL/uL   Hemoglobin 8.9 (L) 12.0 - 15.0 g/dL   HCT 30.8 (L) 65.7 - 84.6 %   MCV 98.8 80.0 - 100.0 fL   MCH 27.1 26.0 - 34.0 pg   MCHC 27.5 (L) 30.0 - 36.0 g/dL   RDW 96.2 (H) 95.2 - 84.1 %   Platelets 294 150 - 400 K/uL   nRBC 0.0 0.0 - 0.2 %    Comment: Performed at [facility], [address] [place], [place] [address]  Comprehensive metabolic panel     Status: Abnormal   Collection Time: 09/02/18 12:27 AM  Result Value Ref Range   Sodium 137 135 - 145 mmol/L   Potassium 5.3 (H) 3.5 - 5.1 mmol/L   Chloride 104 98 - 111 mmol/L   CO2 23 22 - 32 mmol/L   Glucose, Bld 77 70 - 99 mg/dL   BUN 10 6 - 20 mg/dL   Creatinine, Ser 1.02 0.44 - 1.00 mg/dL   Calcium 8.4 (L) 8.9 - 10.3 mg/dL   Total Protein 7.5 6.5 - 8.1 g/dL   Albumin 3.6 3.5 - 5.0 g/dL   AST 725 (H) 15 - 41 U/L   ALT 54 (H) 0 - 44 U/L   Alkaline Phosphatase 483 (H) 38 - 126 U/L   Total Bilirubin 0.9 0.3 - 1.2 mg/dL   GFR calc non Af Amer >60 >60 mL/min   GFR calc Af Amer >60 >60 mL/min   Anion gap 10 5 - 15    Comment: Performed at [facility], [address] [place], [place] [address]  hCG, quantitative, pregnancy     Status: None   Collection Time: 09/02/18 12:27 AM  Result Value Ref Range   hCG, Beta Chain, Quant, S 1 <5 mIU/mL    Comment:          GEST. AGE      CONC.  (mIU/mL)   <=1 WEEK        5 - 50     2 WEEKS       50 - 500     3 WEEKS       100 - 10,000     4 WEEKS      1,000 - 30,000     5 WEEKS     3,500 - 115,000   6-8 WEEKS     12,000 - 270,000    12 WEEKS     15,000 - 220,000        FEMALE AND NON-PREGNANT FEMALE:     LESS THAN 5 mIU/mL Performed at [facility], [address], [place], [place] [address]   Urine Drug Screen, Qualitative ([facility] only)     Status: None   Collection Time: 09/02/18 12:27 AM  Result Value Ref Range   Tricyclic, Ur Screen NONE DETECTED NONE DETECTED   Amphetamines, Ur Screen NONE DETECTED  NONE DETECTED   MDMA (Ecstasy)Ur Screen NONE DETECTED NONE DETECTED   Cocaine Metabolite,Ur [place] NONE DETECTED NONE DETECTED   Opiate, Ur Screen NONE DETECTED NONE DETECTED   Phencyclidine (PCP) Ur S NONE DETECTED NONE DETECTED   Cannabinoid 50 Ng, Ur  NONE DETECTED NONE DETECTED   Barbiturates, Ur Screen NONE DETECTED NONE DETECTED   Benzodiazepine, Ur Scrn NONE DETECTED NONE DETECTED   Methadone Scn, Ur NONE DETECTED NONE DETECTED    Comment: (NOTE) Tricyclics + metabolites, urine    Cutoff 1000 ng/mL Amphetamines + metabolites, urine  Cutoff 1000 ng/mL MDMA (Ecstasy), urine              Cutoff 500 ng/mL Cocaine Metabolite, urine          Cutoff 300 ng/mL Opiate + metabolites, urine        Cutoff 300 ng/mL Phencyclidine (PCP), urine         Cutoff 25 ng/mL Cannabinoid, urine                 Cutoff 50 ng/mL Barbiturates + metabolites, urine  Cutoff 200 ng/mL Benzodiazepine, urine              Cutoff 200 ng/mL Methadone, urine                   Cutoff 300 ng/mL The urine drug screen provides only a preliminary, unconfirmed analytical test result and should not be used for non-medical purposes. Clinical consideration and professional judgment should be applied to any positive drug screen result due to possible interfering substances. A more specific alternate chemical method must be used in order to obtain a confirmed analytical result. Gas chromatography / mass spectrometry (GC/MS) is the preferred confirmat ory  method. Performed at [facility], [address] [place], [place] [address]   Urinalysis, Complete w Microscopic     Status: Abnormal   Collection Time: 09/02/18 12:27 AM  Result Value Ref Range   Color, Urine YELLOW (A) YELLOW   APPearance CLEAR (A) CLEAR   Specific Gravity, Urine 1.016 1.005 - 1.030   pH 6.0 5.0 - 8.0   Glucose, UA NEGATIVE NEGATIVE mg/dL   Hgb urine dipstick NEGATIVE NEGATIVE   Bilirubin Urine NEGATIVE NEGATIVE   Ketones, ur 20 (A) NEGATIVE mg/dL   Protein, ur 30 (A) NEGATIVE mg/dL   Nitrite NEGATIVE NEGATIVE   Leukocytes,Ua NEGATIVE NEGATIVE   RBC / HPF 0-5 0 - 5 RBC/hpf   WBC, UA 6-10 0 - 5 WBC/hpf   Bacteria, UA NONE SEEN NONE SEEN   Squamous Epithelial / LPF 0-5 0 - 5   Mucus PRESENT    Hyaline Casts, UA PRESENT     Comment: Performed at [facility], [address] [place], [place] [address]  SARS Coronavirus 2 (CEPHEID - Performed in [facility] hospital lab), Hosp Order     Status: None   Collection Time: 09/02/18  1:51 AM   Specimen: Nasopharyngeal Swab  Result Value Ref Range   SARS Coronavirus 2 NEGATIVE NEGATIVE    Comment: (NOTE) If result is NEGATIVE SARS-CoV-2 target nucleic acids are NOT DETECTED. The SARS-CoV-2 RNA is generally detectable in upper and lower  respiratory specimens during the acute phase of infection. The lowest  concentration of SARS-CoV-2 viral copies this assay can detect is 250  copies / mL. A negative result does not preclude SARS-CoV-2 infection  and should not be used as the sole basis for treatment or other  patient management  decisions.  A negative result may occur with  improper specimen collection / handling, submission of specimen other  than nasopharyngeal swab, presence of viral mutation(s) within the  areas targeted by this assay, and inadequate number of viral copies  (<250 copies / mL). A negative result must be combined with clinical  observations, patient history, and  epidemiological information. If result is POSITIVE SARS-CoV-2 target nucleic acids are DETECTED. The SARS-CoV-2 RNA is generally detectable in upper and lower  respiratory specimens dur ing the acute phase of infection.  Positive  results are indicative of active infection with SARS-CoV-2.  Clinical  correlation with patient history and other diagnostic information is  necessary to determine patient infection status.  Positive results do  not rule out bacterial infection or co-infection with other viruses. If result is PRESUMPTIVE POSTIVE SARS-CoV-2 nucleic acids MAY BE PRESENT.   A presumptive positive result was obtained on the submitted specimen  and confirmed on repeat testing.  While 2019 novel coronavirus  (SARS-CoV-2) nucleic acids may be present in the submitted sample  additional confirmatory testing may be necessary for epidemiological  and / or clinical management purposes  to differentiate between  SARS-CoV-2 and other Sarbecovirus currently known to infect humans.  If clinically indicated additional testing with an alternate test  methodology [id number]) is advised. The SARS-CoV-2 RNA is generally  detectable in upper and lower respiratory sp ecimens during the acute  phase of infection. The expected result is Negative. Fact Sheet for Patients:  BoilerBrush.com.cyhttps://www.fda.gov/media/136312/download Fact Sheet for Healthcare Providers: https://pope.com/https://www.fda.gov/media/136313/download This test is not yet approved or cleared by the [place] FDA and has been authorized for detection and/or diagnosis of SARS-CoV-2 by FDA under an Emergency Use Authorization (EUA).  This EUA will remain in effect (meaning this test can be used) for the duration of the COVID-19 declaration under Section 564(b)(1) of the Act, 21 U.S.C. section 360bbb-3(b)(1), unless the authorization is terminated or revoked sooner. Performed at [facility], [address] [place], [place] [address]   MRSA PCR  Screening     Status: None   Collection Time: 09/02/18  3:40 AM   Specimen: Nasopharyngeal  Result Value Ref Range   MRSA by PCR NEGATIVE NEGATIVE    Comment:        The GeneXpert MRSA Assay (FDA approved for NASAL specimens only), is one component of a comprehensive MRSA colonization surveillance program. It is not intended to diagnose MRSA infection nor to guide or monitor treatment for MRSA infections. Performed at [facility], [address] [place], [place] [address]   Glucose, capillary     Status: None   Collection Time: 09/02/18  3:45 AM  Result Value Ref Range   Glucose-Capillary 71 70 - 99 mg/dL  HIV antibody (Routine Testing)     Status: None   Collection Time: 09/02/18  4:29 AM  Result Value Ref Range   HIV Screen 4th Generation wRfx Non Reactive Non Reactive    Comment: (NOTE) Performed At: [facility] [address] [place], [place] [id number] Jolene SchimkeNagendra Sanjai MD KG:[phone]   Basic metabolic panel     Status: Abnormal   Collection Time: 09/02/18  4:29 AM  Result Value Ref Range   Sodium 139 135 - 145 mmol/L   Potassium 4.5 3.5 - 5.1 mmol/L   Chloride 107 98 - 111 mmol/L   CO2 23 22 - 32 mmol/L   Glucose, Bld 79 70 - 99 mg/dL   BUN 10 6 - 20 mg/dL  Creatinine, Ser 0.48 0.44 - 1.00 mg/dL   Calcium 8.1 (L) 8.9 - 10.3 mg/dL   GFR calc non Af Amer >60 >60 mL/min   GFR calc Af Amer >60 >60 mL/min   Anion gap 9 5 - 15    Comment: Performed at [facility], [address] [place], [place] [address]  CBC     Status: Abnormal   Collection Time: 09/02/18  4:29 AM  Result Value Ref Range   WBC 9.7 4.0 - 10.5 K/uL   RBC 2.67 (L) 3.87 - 5.11 MIL/uL   Hemoglobin 7.2 (L) 12.0 - 15.0 g/dL   HCT 60.4 (L) 54.0 - 98.1 %   MCV 99.6 80.0 - 100.0 fL   MCH 27.0 26.0 - 34.0 pg   MCHC 27.1 (L) 30.0 - 36.0 g/dL   RDW 19.1 (H) 47.8 - 29.5 %   Platelets 178 150 - 400 K/uL   nRBC 0.0 0.0 - 0.2 %    Comment: Performed at [facility], [address], [place], [place] [address]  Ferritin     Status: None   Collection Time: 09/02/18  4:29 AM  Result Value Ref Range   Ferritin 32 11 - 307 ng/mL    Comment: Performed at [facility], [address] [place], [place] [address]  Iron and TIBC     Status: None   Collection Time: 09/02/18  4:29 AM  Result Value Ref Range   Iron 89 28 - 170 ug/dL   TIBC 469 629 - 528 ug/dL   Saturation Ratios 20 10.4 - 31.8 %   UIBC 350 ug/dL    Comment: Performed at [facility], [address] [place], [place] [address]  Vitamin B12     Status: None   Collection Time: 09/02/18  4:29 AM  Result Value Ref Range   Vitamin B-12 499 180 - 914 pg/mL    Comment: (NOTE) This assay is not validated for testing neonatal or myeloproliferative syndrome specimens for Vitamin B12 levels. Performed at [facility], [address], [place], [place] [address]   Magnesium     Status: None   Collection Time: 09/02/18  4:29 AM  Result Value Ref Range   Magnesium 2.3 1.7 - 2.4 mg/dL    Comment: Performed at [facility], [address] [place], [place] [address]  Phosphorus     Status: None   Collection Time: 09/02/18  4:29 AM  Result Value Ref Range   Phosphorus 4.2 2.5 - 4.6 mg/dL    Comment: Performed at [facility], [address] [place], [place] [address]  Ethanol     Status: None   Collection Time: 09/02/18  4:29 AM  Result Value Ref Range   Alcohol, Ethyl (B) <10 <10 mg/dL    Comment: (NOTE) Lowest detectable limit for serum alcohol is 10 mg/dL. For medical purposes only. Performed at [facility], [address] [place], [place] [address]   Salicylate level     Status: None   Collection Time: 09/02/18  4:29 AM  Result Value Ref Range   Salicylate Lvl <7.0 2.8 - 30.0 mg/dL    Comment: Performed at [facility], [address] [place], [place] [address]  Hemoglobin and hematocrit, blood     Status: Abnormal    Collection Time: 09/02/18  5:14 AM  Result Value Ref Range   Hemoglobin 7.1 (L) 12.0 - 15.0 g/dL   HCT 75.6 (L) 43.3 -  46.0 %    Comment: Performed at [facility], [address] [place], [place] [address]  Type and screen [facility]     Status: None (Preliminary result)   Collection Time: 09/02/18  5:14 AM  Result Value Ref Range   ABO/RH(D) A NEG    Antibody Screen NEG    Sample Expiration 09/05/2018,2359    Unit Number [id number]    Blood Component Type RED CELLS,LR    Unit division 00    Status of Unit ISSUED    Transfusion Status OK TO TRANSFUSE    Crossmatch Result COMPATIBLE   Hemoglobin and hematocrit, blood     Status: Abnormal   Collection Time: 09/02/18 10:59 AM  Result Value Ref Range   Hemoglobin 7.1 (L) 12.0 - 15.0 g/dL   HCT 78.2 (L) 95.6 - 21.3 %    Comment: Performed at [facility], [address] [place], [place] [address]  Glucose, capillary     Status: None   Collection Time: 09/02/18 12:21 PM  Result Value Ref Range   Glucose-Capillary 97 70 - 99 mg/dL  Glucose, capillary     Status: None   Collection Time: 09/02/18  6:07 PM  Result Value Ref Range   Glucose-Capillary 94 70 - 99 mg/dL  Glucose, capillary     Status: None   Collection Time: 09/02/18  7:53 PM  Result Value Ref Range   Glucose-Capillary 91 70 - 99 mg/dL  Glucose, capillary     Status: None   Collection Time: 09/03/18  4:03 AM  Result Value Ref Range   Glucose-Capillary 93 70 - 99 mg/dL  Basic metabolic panel     Status: Abnormal   Collection Time: 09/03/18  4:20 AM  Result Value Ref Range   Sodium 133 (L) 135 - 145 mmol/L   Potassium 4.2 3.5 - 5.1 mmol/L   Chloride 100 98 - 111 mmol/L   CO2 27 22 - 32 mmol/L   Glucose, Bld 92 70 - 99 mg/dL   BUN <5 (L) 6 - 20 mg/dL   Creatinine, Ser <8.46 (L) 0.44 - 1.00 mg/dL   Calcium 8.5 (L) 8.9 - 10.3 mg/dL   GFR calc non Af Amer NOT CALCULATED >60 mL/min   GFR calc Af Amer NOT CALCULATED >60  mL/min   Anion gap 6 5 - 15    Comment: Performed at [facility], [address] [place], [place] [address]  Magnesium     Status: None   Collection Time: 09/03/18  4:20 AM  Result Value Ref Range   Magnesium 2.1 1.7 - 2.4 mg/dL    Comment: Performed at [facility], [address] [place], [place] [address]  Phosphorus     Status: Abnormal   Collection Time: 09/03/18  4:20 AM  Result Value Ref Range   Phosphorus 1.9 (L) 2.5 - 4.6 mg/dL    Comment: Performed at [facility], [address] [place], [place] [address]  CBC     Status: Abnormal   Collection Time: 09/03/18  4:20 AM  Result Value Ref Range   WBC 5.2 4.0 - 10.5 K/uL   RBC 2.54 (L) 3.87 - 5.11 MIL/uL   Hemoglobin 6.9 (L) 12.0 - 15.0 g/dL   HCT 02.7 (L) 25.3 - 66.4 %   MCV 98.4 80.0 - 100.0 fL   MCH 27.2 26.0 - 34.0 pg   MCHC 27.6 (L) 30.0 - 36.0 g/dL   RDW 40.3 (H) 47.4 -  15.5 %   Platelets 163 150 - 400 K/uL   nRBC 0.0 0.0 - 0.2 %    Comment: Performed at [facility], [address] [place], [place] [address]  ABO/Rh     Status: None   Collection Time: 09/03/18  4:20 AM  Result Value Ref Range   ABO/RH(D) A POS    DAT, IgG NEG    Weak D      POS Performed at [facility], [address] [place], [place] [address]   Prepare RBC     Status: None   Collection Time: 09/03/18  5:31 AM  Result Value Ref Range   Order Confirmation      ORDER PROCESSED BY BLOOD BANK Performed at [facility], [address] [place], [place] [address]   Hepatic function panel     Status: Abnormal   Collection Time: 09/03/18 10:00 AM  Result Value Ref Range   Total Protein 6.8 6.5 - 8.1 g/dL   Albumin 3.1 (L) 3.5 - 5.0 g/dL   AST 80 (H) 15 - 41 U/L   ALT 41 0 - 44 U/L   Alkaline Phosphatase 325 (H) 38 - 126 U/L   Total Bilirubin 0.5 0.3 - 1.2 mg/dL   Bilirubin, Direct 0.2 0.0 - 0.2 mg/dL   Indirect Bilirubin 0.3 0.3 - 0.9 mg/dL    Comment: Performed at [facility], [address] [place], [place] [address]  Protime-INR     Status: None   Collection Time: 09/03/18 10:00 AM  Result Value Ref Range   Prothrombin Time 12.6 11.4 - 15.2 seconds   INR 1.0 0.8 - 1.2    Comment: (NOTE) INR goal varies based on device and disease states. Performed at [facility], [address] [place], [place] [address]   APTT     Status: None   Collection Time: 09/03/18 10:00 AM  Result Value Ref Range   aPTT 28 24 - 36 seconds    Comment: Performed at [facility], [address] [place], [place] [address]  Lithium level     Status: Abnormal   Collection Time: 09/03/18 10:00 AM  Result Value Ref Range   Lithium Lvl <0.06 (L) 0.60 - 1.20 mmol/L    Comment: Performed at [facility], [address], [place], [place] [address]  Urine Drug Screen, Qualitative ([facility] only)     Status: Abnormal   Collection Time: 09/03/18 12:25 PM  Result Value Ref Range   Tricyclic, Ur Screen NONE DETECTED NONE DETECTED   Amphetamines, Ur Screen NONE DETECTED NONE DETECTED   MDMA (Ecstasy)Ur Screen NONE DETECTED NONE DETECTED   Cocaine Metabolite,Ur [place] NONE DETECTED NONE DETECTED   Opiate, Ur Screen NONE DETECTED NONE DETECTED   Phencyclidine (PCP) Ur S NONE DETECTED NONE DETECTED   Cannabinoid 50 Ng, Ur [place] NONE DETECTED NONE DETECTED   Barbiturates, Ur Screen NONE DETECTED NONE DETECTED   Benzodiazepine, Ur Scrn POSITIVE (A) NONE DETECTED   Methadone Scn, Ur NONE DETECTED NONE DETECTED    Comment: (NOTE) Tricyclics + metabolites, urine    Cutoff 1000 ng/mL Amphetamines + metabolites, urine  Cutoff 1000 ng/mL MDMA (Ecstasy), urine              Cutoff 500 ng/mL Cocaine Metabolite, urine          Cutoff 300 ng/mL Opiate + metabolites, urine        Cutoff 300 ng/mL Phencyclidine (PCP), urine  Cutoff 25 ng/mL Cannabinoid, urine                 Cutoff 50 ng/mL Barbiturates + metabolites, urine  Cutoff 200 ng/mL Benzodiazepine,  urine              Cutoff 200 ng/mL Methadone, urine                   Cutoff 300 ng/mL The urine drug screen provides only a preliminary, unconfirmed analytical test result and should not be used for non-medical purposes. Clinical consideration and professional judgment should be applied to any positive drug screen result due to possible interfering substances. A more specific alternate chemical method must be used in order to obtain a confirmed analytical result. Gas chromatography / mass spectrometry (GC/MS) is the preferred confirmat ory method. Performed at [facility], [address] [place], [place] [address]     Current Facility-Administered Medications  Medication Dose Route Frequency Provider Last Rate Last Dose  . 0.9 %  sodium chloride infusion (Manually program via Guardrails IV Fluids)   Intravenous Once Judithe Modest, NP      . Chlorhexidine Gluconate Cloth 2 % PADS 6 each  6 each Topical Q0600 Eugenie Norrie, NP   6 each at 09/03/18 0636  . famotidine (PEPCID) tablet 20 mg  20 mg Oral BID Vida Rigger, MD   20 mg at 09/03/18 1118  . FLUoxetine (PROZAC) capsule 40 mg  40 mg Oral Daily Alford Highland, MD   40 mg at 09/03/18 1119  . folic acid (FOLVITE) tablet 1 mg  1 mg Oral Daily Wieting, Richard, MD   1 mg at 09/03/18 1118  . hydrOXYzine (ATARAX/VISTARIL) tablet 25 mg  25 mg Oral BID PRN Trustin Chapa, Gerlene Burdock, FNP      . lactated ringers infusion   Intravenous Continuous Vida Rigger, MD 75 mL/hr at 09/03/18 0500    . LORazepam (ATIVAN) tablet 1 mg  1 mg Oral Q6H PRN Alford Highland, MD   1 mg at 09/03/18 0204   Or  . LORazepam (ATIVAN) injection 1 mg  1 mg Intravenous Q6H PRN Wieting, Richard, MD      . multivitamin with minerals tablet 1 tablet  1 tablet Oral Daily Wieting, Richard, MD   1 tablet at 09/03/18 1118  . naloxone (NARCAN) 4 mg in dextrose 5 % 250 mL infusion  0.25 mg/hr Intravenous Continuous Judithe Modest, NP   Stopped at 09/03/18  0800  . nicotine (NICODERM CQ - dosed in mg/24 hours) patch 21 mg  21 mg Transdermal Daily Harlon Ditty D, NP   21 mg at 09/03/18 1119  . ondansetron (ZOFRAN) tablet 4 mg  4 mg Oral Q6H PRN Alford Highland, MD       Or  . ondansetron (ZOFRAN) injection 4 mg  4 mg Intravenous Q6H PRN Wieting, Richard, MD      . pantoprazole (PROTONIX) injection 40 mg  40 mg Intravenous Q12H Harlon Ditty D, NP   40 mg at 09/03/18 2952  . sodium phosphate 30 mmol in dextrose 5 % 250 mL infusion  30 mmol Intravenous Once Bertram Savin, RPH 43 mL/hr at 09/03/18 1341 30 mmol at 09/03/18 1341  . thiamine (VITAMIN B-1) tablet 100 mg  100 mg Oral Daily Bertram Savin, RPH   100 mg at 09/03/18 1118    Musculoskeletal: Strength & Muscle Tone: decreased improving Gait & Station: Patient remained sitting in bed during evaluation Patient  leans: N/A  Psychiatric Specialty Exam: Physical Exam  Nursing note and vitals reviewed. Constitutional: She is oriented to person, place, and time. She appears well-developed and well-nourished.  Respiratory: Effort normal.  Musculoskeletal: Normal range of motion.  Neurological: She is oriented to person, place, and time.    Review of Systems  Constitutional: Negative.   HENT: Negative.   Eyes: Negative.   Respiratory: Negative.   Cardiovascular: Negative.   Gastrointestinal: Negative.   Genitourinary: Negative.   Musculoskeletal: Negative.   Skin: Negative.   Neurological: Negative.   Endo/Heme/Allergies: Negative.   Psychiatric/Behavioral: Positive for substance abuse. Negative for hallucinations and suicidal ideas. The patient is nervous/anxious.     Blood pressure (!) 143/57, pulse (!) 107, temperature 98.7 F (37.1 C), temperature source Oral, resp. rate 18, height 5\' 4"  (1.626 m), weight 67.5 kg, SpO2 96 %.Body mass index is 25.54 kg/m.  General Appearance: Casual  Eye Contact:  Good  Speech:  Clear and Coherent and Normal Rate  Volume:  Normal   Mood:  Euthymic  Affect:  Congruent  Thought Process:  Coherent and Descriptions of Associations: Intact  Orientation:  Full (Time, Place, and Person)  Thought Content:  Logical  Suicidal Thoughts:  No  Homicidal Thoughts:  No  Memory:  Immediate;   Good Recent;   Good Remote;   Good  Judgement:  Fair  Insight:  Fair  Psychomotor Activity:  Normal  Concentration:  Concentration: Good and Attention Span: Good  Recall:  Good  Fund of Knowledge:  Good  Language:  Good  Akathisia:  No  Handed:  Right  AIMS (if indicated):     Assets:  Communication Skills Desire for Improvement Housing Resilience Social Support Transportation  ADL's:  Intact  Cognition:  WNL  Sleep:        Treatment Plan Summary: Daily contact with patient to assess and evaluate symptoms and progress in treatment and Medication management  Continue Prozac 40 mg PO Daily Restart Vistaril 25 mg PO BID PRN for anxiety Social work consult for assistance with establishing a residential rehab Follow up at [facility] for mental health and substance abuse treatment  Disposition: No evidence of imminent risk to self or others at present.   Patient does not meet criteria for psychiatric inpatient admission.  Gerlene Burdock Mirenda Baltazar, FNP 09/03/2018 3:10 PM

## 2018-09-03 NOTE — Progress Notes (Signed)
Pharmacy Electrolyte Monitoring Consult:  Pharmacy consulted to assist in monitoring and replacing electrolytes in this 43 y.o. female admitted on 09/01/2018 with Drug Overdose  Patient's UDS was negative. Patient intermittently requiring narcan drip. Patient has past history significant for alcohol abuse, gastric bypass surgery, and depression.    Labs:  Sodium (mmol/L)  Date Value  09/03/2018 133 (L)   Potassium (mmol/L)  Date Value  09/03/2018 4.2   Magnesium (mg/dL)  Date Value  09/03/2018 2.1   Phosphorus (mg/dL)  Date Value  09/03/2018 1.9 (L)   Calcium (mg/dL)  Date Value  09/03/2018 8.5 (L)   Albumin (g/dL)  Date Value  09/03/2018 3.1 (L)    Assessment/Plan: Patient receiving LR at 35mL/hr.   Will order sodium phospate 5mmOL IV x 1.   Will replace for goal potassium ~ 4, goal magnesium ~ 2, and goal phosphorus > 2.5.   Will obtain follow up electrolytes with am labs.   Pharmacy will continue to monitor and adjust per consult.   Miray Mancino L 09/03/2018 11:22 AM

## 2018-09-03 NOTE — TOC Initial Note (Addendum)
Transition of Care [facility]) - Initial/Assessment Note    Patient Details  Name: Ellen Smith MRN: [id number] Date of Birth: 1976/02/03  Transition [facility]) CM/SW Contact:    Candie Chroman, LCSW Phone Number: 09/03/2018, 1:10 PM  Clinical Narrative: CSW met with patient, introduced role, and inquired about interest in substance abuse treatment resources. Patient is agreeable. She confirmed she follows up at [facility] for outpatient services but is looking into 28-day programs. She gave CSW permission to call around to local centers. [facility] in [place] is a males only program. [facility] is a 3-6 month program but they are taking new female referrals. [facility] is a program for women who are pregnant or parenting small children. [facility] serves [employer] only. Will continue to search. Patient said her fiance' is researching options as well. She plans to return home for several days before hopefully starting treatment. She understands there can be no guarantees in finding placement for her prior to discharge and says she will continue to follow up with [facility] if we are unable to find something.                 1:48 pm CSW continues to call residential programs:  [facility]: Patient had been there before. Their program is 2 years long. Patient would have to call and set up a phone interview. [facility]: No answer. [facility]: Number provided is not active. [facility]: 6-12 month program. There is an application on their website. [facility]: 21 day program. Faxed referral with patient's permission.   Also gave patient the phone number to [facility]. CSW spoke to [place] who said if patient calls they can do a phone assessment to determine patient's needs.  Expected Discharge Plan: Home/Self Care Barriers to Discharge: Continued Medical Work up   Patient Goals and CMS Choice        Expected Discharge Plan and  Services Expected Discharge Plan: Home/Self Care       Living arrangements for the past 2 months: Single Family Home                                      Prior Living Arrangements/Services Living arrangements for the past 2 months: Single Family Home Lives with:: Other (Comment)(Fiance') Patient language and need for interpreter reviewed:: Yes Do you feel safe going back to the place where you live?: Yes      Need for Family Participation in Patient Care: Yes (Comment) Care giver support system in place?: Yes (comment)   Criminal Activity/Legal Involvement Pertinent to Current Situation/Hospitalization: No - Comment as needed  Activities of Daily Living      Permission Sought/Granted Permission sought to share information with : Facility Art therapist granted to share information with : Yes, Verbal Permission Granted     Permission granted to share info w AGENCY: Residential substance use treatment centers        Emotional Assessment Appearance:: Appears stated age Attitude/Demeanor/Rapport: Engaged, Gracious Affect (typically observed): Accepting, Appropriate, Calm, Pleasant Orientation: : Oriented to Self, Oriented to Place, Oriented to  Time, Oriented to Situation Alcohol / Substance Use: Illicit Drugs, Alcohol Use Psych Involvement: Yes (comment), Outpatient Provider(Hospital psych service signed off. Patient follows up with [facility] for outpatient treatment.)  Admission diagnosis:  Accidental overdose of heroin, initial encounter ([place]) [T40.1X1A] Overdose [T50.901A] Patient Active Problem List   Diagnosis  Date Noted  . Overdose 09/02/2018  . Alcohol dependence ([place]) 09/02/2018  . Opiate abuse, episodic ([place]) 09/02/2018  . MDD (major depressive disorder), severe ([place]) 09/02/2018   PCP:  Patient, No Pcp Per Pharmacy:   [facility] [address], [place] [address] Phone: [phone]  Fax: [phone]     Social Determinants of Health (SDOH) Interventions    Readmission Risk Interventions No flowsheet data found.

## 2018-09-03 NOTE — Progress Notes (Signed)
PHARMACIST - PHYSICIAN COMMUNICATION  CONCERNING: IV to Oral Route Change Policy  RECOMMENDATION: This patient is receiving thiamine by the intravenous route.  Based on criteria approved by the Pharmacy and Therapeutics Committee, the intravenous medication(s) is/are being converted to the equivalent oral dose form(s).   DESCRIPTION: These criteria include:  The patient is eating (either orally or via tube) and/or has been taking other orally administered medications for a least 24 hours  The patient has no evidence of active gastrointestinal bleeding or impaired GI absorption (gastrectomy, short bowel, patient on TNA or NPO).  If you have questions about this conversion, please contact the Pharmacy Department  []   [phone] )  [place], [facility] 09/03/2018 10:31 AM

## 2018-09-03 NOTE — Progress Notes (Signed)
Turned off pts narcan gtt at 0800. Pt is drowsy, but is easily awakened and is alert and oriented after awakened. Will keep gtt off for now and continue to monitor closely.

## 2018-09-04 LAB — BPAM RBC
Blood Product Expiration Date: 202008082359
ISSUE DATE / TIME: 202007301154
Unit Type and Rh: 600

## 2018-09-04 LAB — HEPATITIS C ANTIBODY: HCV Ab: <0.1 s/co ratio (ref 0.0–0.9)

## 2018-09-04 LAB — TYPE AND SCREEN
ABO/RH(D): A NEG
Antibody Screen: NEGATIVE
Unit division: 0

## 2018-09-04 LAB — CBC
HCT: 29.6 % — ABNORMAL LOW (ref 36.0–46.0)
Hemoglobin: 8.7 g/dL — ABNORMAL LOW (ref 12.0–15.0)
MCH: 28.2 pg (ref 26.0–34.0)
MCHC: 29.4 g/dL — ABNORMAL LOW (ref 30.0–36.0)
MCV: 96.1 fL (ref 80.0–100.0)
Platelets: 155 10*3/uL (ref 150–400)
RBC: 3.08 MIL/uL — ABNORMAL LOW (ref 3.87–5.11)
RDW: 20.2 % — ABNORMAL HIGH (ref 11.5–15.5)
WBC: 5.5 10*3/uL (ref 4.0–10.5)
nRBC: 0 % (ref 0.0–0.2)

## 2018-09-04 LAB — BASIC METABOLIC PANEL
Anion gap: 8 (ref 5–15)
BUN: <5 mg/dL — ABNORMAL LOW (ref 6–20)
CO2: 27 mmol/L (ref 22–32)
Calcium: 8.7 mg/dL — ABNORMAL LOW (ref 8.9–10.3)
Chloride: 100 mmol/L (ref 98–111)
Creatinine, Ser: <0.3 mg/dL — ABNORMAL LOW (ref 0.44–1.00)
Glucose, Bld: 88 mg/dL (ref 70–99)
Potassium: 4 mmol/L (ref 3.5–5.1)
Sodium: 135 mmol/L (ref 135–145)

## 2018-09-04 LAB — GLUCOSE, CAPILLARY
Glucose-Capillary: 85 mg/dL (ref 70–99)
Glucose-Capillary: 87 mg/dL (ref 70–99)

## 2018-09-04 LAB — MAGNESIUM: Magnesium: 2 mg/dL (ref 1.7–2.4)

## 2018-09-04 LAB — PHOSPHORUS: Phosphorus: 3 mg/dL (ref 2.5–4.6)

## 2018-09-04 MED ORDER — LORAZEPAM 2 MG/ML IJ SOLN
2.0000 mg | INTRAMUSCULAR | Status: DC | PRN
  Administered 2018-09-04: 2 mg via INTRAVENOUS
  Filled 2018-09-04: qty 1

## 2018-09-04 MED ORDER — IBUPROFEN 400 MG PO TABS
600.0000 mg | ORAL_TABLET | Freq: Four times a day (QID) | ORAL | Status: DC | PRN
  Administered 2018-09-04: 03:00:00 600 mg via ORAL
  Filled 2018-09-04: qty 2

## 2018-09-04 NOTE — Plan of Care (Signed)
Care plan complete. Ready for discharge.

## 2018-09-04 NOTE — Progress Notes (Signed)
CRITICAL CARE PROGRESS NOTE    Name: Callen Zuba MRN: [id number] DOB: Mar 28, 1975     LOS: 2   SUBJECTIVE FINDINGS & SIGNIFICANT EVENTS   Patient description: 43 yo with MDD , drug and alcohol abuse hx came in obtunded hypoxemic s/p drug/alcohol overdose   Lines / Drains: PIV  Cultures / Sepsis markers: MRSA nasal normal COVID negative  Antibiotics: None   Tests / Events: HCV and repeat Urine drug test  Overnight: Clinically impromved will optimize for d/c    PAST MEDICAL HISTORY   Past Medical History:  Diagnosis Date  . Alcohol abuse      SURGICAL HISTORY   Past Surgical History:  Procedure Laterality Date  . GASTRIC BYPASS       FAMILY HISTORY   Family History  Problem Relation Age of Onset  . Hypertension Mother      SOCIAL HISTORY   Social History   Tobacco Use  . Smoking status: Current Every Day Smoker    Packs/day: 1.00    Years: 20.00    Pack years: 20.00    Types: Cigarettes  . Smokeless tobacco: Never Used  Substance Use Topics  . Alcohol use: Yes    Alcohol/week: 12.0 standard drinks    Types: 12 Cans of beer per week  . Drug use: Not on file     MEDICATIONS   Current Medication:  Current Facility-Administered Medications:  .  0.9 %  sodium chloride infusion (Manually program via Guardrails IV Fluids), , Intravenous, Once, Bradly Bienenstock, NP .  Chlorhexidine Gluconate Cloth 2 % PADS 6 each, 6 each, Topical, Q0600, Awilda Bill, NP, 6 each at 09/04/18 [phone] .  famotidine (PEPCID) tablet 20 mg, 20 mg, Oral, BID, Rosa Wyly, MD, 20 mg at 09/03/18 2245 .  FLUoxetine (PROZAC) capsule 40 mg, 40 mg, Oral, Daily, Leslye Peer, Richard, MD, 40 mg at 09/03/18 1119 .  folic acid (FOLVITE) tablet 1 mg, 1 mg, Oral, Daily, Wieting, Richard, MD, 1 mg at 09/03/18  1118 .  hydrOXYzine (ATARAX/VISTARIL) tablet 25 mg, 25 mg, Oral, BID PRN, Money, Darnelle Maffucci B, FNP .  ibuprofen (ADVIL) tablet 600 mg, 600 mg, Oral, Q6H PRN, Darel Hong D, NP, 600 mg at 09/04/18 0237 .  LORazepam (ATIVAN) injection 2-3 mg, 2-3 mg, Intravenous, Q1H PRN, Darel Hong D, NP, 2 mg at 09/04/18 0504 .  multivitamin with minerals tablet 1 tablet, 1 tablet, Oral, Daily, Loletha Grayer, MD, 1 tablet at 09/03/18 1118 .  nicotine (NICODERM CQ - dosed in mg/24 hours) patch 21 mg, 21 mg, Transdermal, Daily, Darel Hong D, NP, 21 mg at 09/03/18 1119 .  ondansetron (ZOFRAN) tablet 4 mg, 4 mg, Oral, Q6H PRN **OR** ondansetron (ZOFRAN) injection 4 mg, 4 mg, Intravenous, Q6H PRN, Wieting, Richard, MD .  pantoprazole (PROTONIX) injection 40 mg, 40 mg, Intravenous, Q12H, Darel Hong D, NP, 40 mg at 09/03/18 2245 .  thiamine (VITAMIN B-1) tablet 100 mg, 100 mg, Oral, Daily, Charlett Nose, RPH, 100 mg at 09/03/18 1118    ALLERGIES   Cefaclor    REVIEW OF SYSTEMS    10 point ros not done due to confusion   PHYSICAL EXAMINATION   Vital Signs: Temp:  [98.3 F (36.8 C)-98.7 F (37.1 C)] 98.3 F (36.8 C) (07/31 0200) Pulse Rate:  [83-110] 83 (07/31 0700) Resp:  [13-22] 22 (07/31 0700) BP: (117-175)/(57-103) 120/75 (07/31 0700) SpO2:  [90 %-100 %] 97 % (07/31 0700)  GENERAL:nad  HEAD: Normocephalic, atraumatic.  EYES:  Pupils equal, round, reactive to light.  No scleral icterus.  MOUTH: Moist mucosal membrane. NECK: Supple. No thyromegaly. No nodules. No JVD.  PULMONARY: mild crackles  CARDIOVASCULAR: S1 and S2. Regular rate and rhythm. No murmurs, rubs, or gallops.  GASTROINTESTINAL: Soft, nontender, non-distended. No masses. Positive bowel sounds. No hepatosplenomegaly.  MUSCULOSKELETAL: No swelling, clubbing, or edema.  NEUROLOGIC: Mild distress due to acute illness SKIN:intact,warm,dry   PERTINENT DATA     Infusions:  Scheduled Medications: . sodium  chloride   Intravenous Once  . Chlorhexidine Gluconate Cloth  6 each Topical Q0600  . famotidine  20 mg Oral BID  . FLUoxetine  40 mg Oral Daily  . folic acid  1 mg Oral Daily  . multivitamin with minerals  1 tablet Oral Daily  . nicotine  21 mg Transdermal Daily  . pantoprazole (PROTONIX) IV  40 mg Intravenous Q12H  . thiamine  100 mg Oral Daily   PRN Medications: hydrOXYzine, ibuprofen, LORazepam, ondansetron **OR** ondansetron (ZOFRAN) IV Hemodynamic parameters:   Intake/Output: 07/30 0701 - 07/31 0700 In: 960 [P.O.:960] Out: 250 [Urine:250]  Ventilator  Settings:       LAB RESULTS:  Basic Metabolic Panel: Recent Labs  Lab 09/02/18 0027 09/02/18 0429 09/03/18 0420 09/04/18 0354  NA 137 139 133* 135  K 5.3* 4.5 4.2 4.0  CL 104 107 100 100  CO2 23 23 27 27   GLUCOSE 77 79 92 88  BUN 10 10 <5* <5*  CREATININE 0.65 0.48 <0.30* <0.30*  CALCIUM 8.4* 8.1* 8.5* 8.7*  MG  --  2.3 2.1 2.0  PHOS  --  4.2 1.9* 3.0   Liver Function Tests: Recent Labs  Lab 09/02/18 0027 09/03/18 1000  AST 155* 80*  ALT 54* 41  ALKPHOS 483* 325*  BILITOT 0.9 0.5  PROT 7.5 6.8  ALBUMIN 3.6 3.1*   No results for input(s): LIPASE, AMYLASE in the last 168 hours. No results for input(s): AMMONIA in the last 168 hours. CBC: Recent Labs  Lab 09/02/18 0027 09/02/18 0429 09/02/18 0514 09/02/18 1059 09/03/18 0420 09/03/18 1819 09/04/18 0354  WBC 18.9* 9.7  --   --  5.2 7.8 5.5  HGB 8.9* 7.2* 7.1* 7.1* 6.9* 8.8* 8.7*  HCT 32.4* 26.6* 26.1* 26.0* 25.0* 29.8* 29.6*  MCV 98.8 99.6  --   --  98.4 94.6 96.1  PLT 294 178  --   --  163 168 155   Cardiac Enzymes: No results for input(s): CKTOTAL, CKMB, CKMBINDEX, TROPONINI in the last 168 hours. BNP: Invalid input(s): POCBNP CBG: Recent Labs  Lab 09/03/18 0403 09/03/18 1549 09/03/18 1956 09/03/18 2336 09/04/18 0306  GLUCAP 93 85 97 80 85     IMAGING RESULTS:  Imaging: Dg Chest 2 View  Result Date: 09/03/2018 CLINICAL  DATA:  Hypoxia EXAM: CHEST - 2 VIEW COMPARISON:  09/02/2018 and prior radiographs FINDINGS: The cardiomediastinal silhouette is unremarkable. Mild peribronchial thickening is unchanged. There is no evidence of focal airspace disease, pulmonary edema, suspicious pulmonary nodule/mass, pleural effusion, or pneumothorax. No acute bony abnormalities are identified. Posttraumatic or surgical changes of the distal RIGHT clavicle again noted. IMPRESSION: No active cardiopulmonary disease. Electronically Signed   By: Harmon PierJeffrey  Hu M.D.   On: 09/03/2018 17:51   @PROBHOSP @   ASSESSMENT AND PLAN    -Multidisciplinary rounds held today  Acute Hypoxic Respiratory Failure -due to acute drug overdose -improved with norcan -Wean Fio2 and PEEP as tolerated -repeating drug screen     Severe anemia   -  s/p 1unit prbc transfusion   - HCV ab  - no signs of bleeding on exam   GI/Nutrition GI PROPHYLAXIS as indicated DIET-->TF's as tolerated Constipation protocol as indicated  ENDO - ICU hypoglycemic\Hyperglycemia protocol -check FSBS per protocol   ELECTROLYTES -follow labs as needed -replace as needed -pharmacy consultation   DVT/GI PRX ordered -SCDs  TRANSFUSIONS AS NEEDED MONITOR FSBS ASSESS the need for LABS as needed   This document was prepared using Dragon voice recognition software and may include unintentional dictation errors.    Vida RiggerFuad Lafern Brinkley, M.D.  Division of Pulmonary & Critical Care Medicine  [facility]

## 2018-09-04 NOTE — TOC Transition Note (Signed)
Transition of Care [facility]) - CM/SW Discharge Note   Patient Details  Name: Ellen Smith MRN: [id number] Date of Birth: February 25, 1975  [facility] CM/SW Contact:  Candie Chroman, LCSW Phone Number: 09/04/2018, 11:38 AM   Clinical Narrative: Patient has orders to discharge home today. [facility] confirmed they received the referral that was sent over yesterday and that patient is on the wait list. Due to COVID-19 restrictions they have to limit the number of people in the program so the wait list is 2-3 weeks long. Patient is aware and agreeable. CSW will fax her discharge summary to [facility] once available. CSW called [facility] and they confirmed receipt of Narcan prescription. It will cost $71.69. Patient told RN she would be able to pick it up Monday after she gets paid. Per RN, patient will fill her Prozac at [facility]. Patient stated her fiance' will pick her up today. No further concerns. CSW signing off.   Final next level of care: Home/Self Care Barriers to Discharge: Barriers Resolved   Patient Goals and CMS Choice        Discharge Placement                Patient to be transferred to facility by: Her fiance' will pick her up.   Patient and family notified of of transfer: 09/04/18  Discharge Plan and Services                                     Social Determinants of Health (SDOH) Interventions     Readmission Risk Interventions No flowsheet data found.

## 2018-09-04 NOTE — Plan of Care (Signed)

## 2018-09-04 NOTE — Progress Notes (Signed)
Patient discharged to home today. Plan in place to enter a rehab/detox center this weekend. Patient given list of agencies and instructed to call. Prescription for Narcan sent to [facility]; patient states she will fill script on Monday if she is not in a treatment center. VS stable. Patient is in good spirits and is leaving in good general physical condition.

## 2018-09-04 NOTE — Discharge Instructions (Signed)
Please seek medical attention for any high fevers, chest pain, shortness of breath, change in behavior, persistent vomiting, bloody stool or any other new or concerning symptoms.  Take medications only prescribed to you and as directed.

## 2018-09-06 NOTE — Discharge Summary (Signed)
[facility] [place] NAME: Ellen Smith    MR#:  [id number]  DATE OF BIRTH:  Sep 26, 1975  DATE OF ADMISSION:  09/01/2018 ADMITTING PHYSICIAN: Loletha Grayer, MD  DATE OF DISCHARGE: 09/04/2018 12:44 PM  PRIMARY CARE PHYSICIAN: Patient, No Pcp Per   ADMISSION DIAGNOSIS:  Accidental overdose of heroin, initial encounter ([place]) [T40.1X1A] Overdose [T50.901A]  DISCHARGE DIAGNOSIS:  Active Problems:   Overdose   Alcohol dependence ([place])   Opiate abuse, episodic ([facility])   MDD (major depressive disorder), severe ([place])   SECONDARY DIAGNOSIS:   Past Medical History:  Diagnosis Date  . Alcohol abuse      ADMITTING HISTORY  HISTORY OF PRESENT ILLNESS:  Ellen Smith  is a 43 y.o. female coming in with altered mental status.  I spoke with the patient's significant other and she stated that the patient is not a drug abuser but does take Percocet.  Apparently she had some heroin also.  The patient's major issue is alcohol abuse.  The patient significant other went out for a few minutes and came back and the patient was face down and gray and she started CPR.  When EMS arrived they gave Narcan and she started coming through.  In the emergency room give another 2 doses of Narcan.  Patient was still hypoxic on 4 L of oxygen.  When I went into the room to evaluate for admission she was less responsive again and needed another dose of Narcan and was started on a Narcan drip.  Patient not able to give any history at this time.  History obtained from old chart and significant other.  HOSPITAL COURSE:   1.Drug overdose with Percocet and heroin. Started on Narcan drip.  By day of discharge she is not needing any further Narcan and is alert and awake. Patient was monitored in stepdown unit. Appreciate input by psychiatry team.  Patient denied intentional use.  Was recreational use.    Counseled.  Follow-up with [facility].  2. Acute hypoxic respiratory failure  secondary to drug overdose.  Due to shallow respirations with narcotic overdose.  Chest x-ray clear.  Saturations normal on day of discharge.  3. Alcohol abuse.  CIWA protocol No withdrawals in the hospital  4. Elevated liver function test secondary to alcohol abuse.  5. Leukocytosis. Resolved  6. Chronic anemia  Likely dilutional. No bleeding Stable  7. Hyperkalemia. Resolved  Patient ambulated around the nurses station on day of discharge with normal saturations.  Alert and oriented.  Counseled.  Discharged home.  CONSULTS OBTAINED:  Treatment Team:  [facility], [facility], MD  DRUG ALLERGIES:   Allergies  Allergen Reactions  . Cefaclor Hives    DISCHARGE MEDICATIONS:   Allergies as of 09/04/2018      Reactions   Cefaclor Hives      Medication List    TAKE these medications   diphenhydrAMINE 25 mg capsule Commonly known as: BENADRYL Take 25 mg by mouth every 6 (six) hours as needed for allergies.   FLUoxetine 40 MG capsule Commonly known as: PROZAC Take 40 mg by mouth daily.   GOODY HEADACHE PO Take 1 packet by mouth every 4 (four) hours as needed.   hydrOXYzine 50 MG capsule Commonly known as: VISTARIL Take 25 mg by mouth 3 (three) times daily as needed.   naloxone 4 MG/0.1ML Liqd nasal spray kit Commonly known as: NARCAN To use in case of opioid overdose and difficulty breathing       Today  VITAL SIGNS:  Blood pressure (!) 170/95, pulse 82, temperature 98.2 F (36.8 C), temperature source Oral, resp. rate 18, height 5' 4"  (1.626 m), weight 67.5 kg, SpO2 96 %.  I/O:  No intake or output data in the 24 hours ending 09/06/18 1339  PHYSICAL EXAMINATION:  Physical Exam  GENERAL:  43 y.o.-year-old patient lying in the bed with no acute distress.  LUNGS: Normal breath sounds bilaterally, no wheezing, rales,rhonchi or crepitation. No use of accessory muscles of respiration.  CARDIOVASCULAR: S1, S2 normal. No murmurs, rubs, or gallops.   ABDOMEN: Soft, non-tender, non-distended. Bowel sounds present. No organomegaly or mass.  NEUROLOGIC: Moves all 4 extremities. PSYCHIATRIC: The patient is alert and oriented x 3.  SKIN: No obvious rash, lesion, or ulcer.   DATA REVIEW:   CBC Recent Labs  Lab 09/04/18 0354  WBC 5.5  HGB 8.7*  HCT 29.6*  PLT 155    Chemistries  Recent Labs  Lab 09/03/18 1000 09/04/18 0354  NA  --  135  K  --  4.0  CL  --  100  CO2  --  27  GLUCOSE  --  88  BUN  --  <5*  CREATININE  --  <0.30*  CALCIUM  --  8.7*  MG  --  2.0  AST 80*  --   ALT 41  --   ALKPHOS 325*  --   BILITOT 0.5  --     Cardiac Enzymes No results for input(s): TROPONINI in the last 168 hours.  Microbiology Results  Results for orders placed or performed during the hospital encounter of 09/01/18  Novel Coronavirus,NAA,(SEND-OUT TO REF LAB - TAT 24-48 hrs); Hosp Order     Status: None   Collection Time: 09/02/18 12:27 AM   Specimen: Nasopharyngeal Swab; Respiratory  Result Value Ref Range Status   SARS-CoV-2, NAA NOT DETECTED NOT DETECTED Final    Comment: (NOTE) This test was developed and its performance characteristics determined by [employer]. This test has not been FDA cleared or approved. This test has been authorized by FDA under an Emergency Use Authorization (EUA). This test is only authorized for the duration of time the declaration that circumstances exist justifying the authorization of the emergency use of in vitro diagnostic tests for detection of SARS-CoV-2 virus and/or diagnosis of COVID-19 infection under section 564(b)(1) of the Act, 21 U.S.C. 939QZE-0(P)(2), unless the authorization is terminated or revoked sooner. When diagnostic testing is negative, the possibility of a false negative result should be considered in the context of a patient's recent exposures and the presence of clinical signs and symptoms consistent with COVID-19. An individual without symptoms of COVID-19  and who is not shedding SARS-CoV-2 virus would expect to have a negative (not detected) result in this assay. Performed  At: [facility] [address] [place], [place] [id number] Rush Farmer MD JF:[id number]    [place]  Final    Comment: Performed at [facility], [address] [place], [place] [address]  SARS Coronavirus 2 (CEPHEID - Performed in [facility] hospital lab), Hosp Order     Status: None   Collection Time: 09/02/18  1:51 AM   Specimen: Nasopharyngeal Swab  Result Value Ref Range Status   SARS Coronavirus 2 NEGATIVE NEGATIVE Final    Comment: (NOTE) If result is NEGATIVE SARS-CoV-2 target nucleic acids are NOT DETECTED. The SARS-CoV-2 RNA is generally detectable in upper and lower  respiratory specimens during the acute phase of infection. The lowest  concentration of SARS-CoV-2 viral copies this assay can detect is 250  copies / mL. A negative result does not preclude SARS-CoV-2 infection  and should not be used as the sole basis for treatment or other  patient management decisions.  A negative result may occur with  improper specimen collection / handling, submission of specimen other  than nasopharyngeal swab, presence of viral mutation(s) within the  areas targeted by this assay, and inadequate number of viral copies  (<250 copies / mL). A negative result must be combined with clinical  observations, patient history, and epidemiological information. If result is POSITIVE SARS-CoV-2 target nucleic acids are DETECTED. The SARS-CoV-2 RNA is generally detectable in upper and lower  respiratory specimens dur ing the acute phase of infection.  Positive  results are indicative of active infection with SARS-CoV-2.  Clinical  correlation with patient history and other diagnostic information is  necessary to determine patient infection status.  Positive results do  not rule out bacterial infection or co-infection with other  viruses. If result is PRESUMPTIVE POSTIVE SARS-CoV-2 nucleic acids MAY BE PRESENT.   A presumptive positive result was obtained on the submitted specimen  and confirmed on repeat testing.  While 2019 novel coronavirus  (SARS-CoV-2) nucleic acids may be present in the submitted sample  additional confirmatory testing may be necessary for epidemiological  and / or clinical management purposes  to differentiate between  SARS-CoV-2 and other Sarbecovirus currently known to infect humans.  If clinically indicated additional testing with an alternate test  methodology [id number]) is advised. The SARS-CoV-2 RNA is generally  detectable in upper and lower respiratory sp ecimens during the acute  phase of infection. The expected result is Negative. Fact Sheet for Patients:  StrictlyIdeas.no Fact Sheet for Healthcare Providers: BankingDealers.co.za This test is not yet approved or cleared by the [place] FDA and has been authorized for detection and/or diagnosis of SARS-CoV-2 by FDA under an Emergency Use Authorization (EUA).  This EUA will remain in effect (meaning this test can be used) for the duration of the COVID-19 declaration under Section 564(b)(1) of the Act, 21 U.S.C. section 360bbb-3(b)(1), unless the authorization is terminated or revoked sooner. Performed at [facility], [address] [place], [place] [address]   MRSA PCR Screening     Status: None   Collection Time: 09/02/18  3:40 AM   Specimen: Nasopharyngeal  Result Value Ref Range Status   MRSA by PCR NEGATIVE NEGATIVE Final    Comment:        The GeneXpert MRSA Assay (FDA approved for NASAL specimens only), is one component of a comprehensive MRSA colonization surveillance program. It is not intended to diagnose MRSA infection nor to guide or monitor treatment for MRSA infections. Performed at [facility], [address] [place], [place]  [address]     RADIOLOGY:  No results found.  Follow up with PCP in 1 week.  Management plans discussed with the patient, family and they are in agreement.  CODE STATUS:  Code Status History    Date Active Date Inactive Code Status Order ID Comments User Context   09/02/2018 0121 09/04/2018 0947 Full Code [id number]  Loletha Grayer, MD ED   Advance Care Planning Activity      TOTAL TIME TAKING CARE OF THIS PATIENT ON DAY OF DISCHARGE: more than 30 minutes.   Leia Alf Sherran Margolis M.D on 09/06/2018 at 1:39 PM  Between 7am to 6pm - Pager - [phone]  After 6pm go to [email or web address] -  password [email or web address] [facility]  Office  [phone]  CC: Primary care physician; Patient, No Pcp Per  Note: This dictation was prepared with Dragon dictation along with smaller phrase technology. Any transcriptional errors that result from this process are unintentional.

## 2019-02-12 ENCOUNTER — Other Ambulatory Visit: Payer: Self-pay

## 2019-02-12 DIAGNOSIS — F1010 Alcohol abuse, uncomplicated: Secondary | ICD-10-CM | POA: Diagnosis present

## 2019-02-12 DIAGNOSIS — Z6829 Body mass index (BMI) 29.0-29.9, adult: Secondary | ICD-10-CM

## 2019-02-12 DIAGNOSIS — Z20822 Contact with and (suspected) exposure to covid-19: Secondary | ICD-10-CM | POA: Diagnosis present

## 2019-02-12 DIAGNOSIS — K760 Fatty (change of) liver, not elsewhere classified: Secondary | ICD-10-CM | POA: Diagnosis present

## 2019-02-12 DIAGNOSIS — F1110 Opioid abuse, uncomplicated: Secondary | ICD-10-CM | POA: Diagnosis present

## 2019-02-12 DIAGNOSIS — Z9884 Bariatric surgery status: Secondary | ICD-10-CM

## 2019-02-12 DIAGNOSIS — G621 Alcoholic polyneuropathy: Secondary | ICD-10-CM | POA: Diagnosis present

## 2019-02-12 DIAGNOSIS — E162 Hypoglycemia, unspecified: Secondary | ICD-10-CM | POA: Diagnosis present

## 2019-02-12 DIAGNOSIS — E8770 Fluid overload, unspecified: Secondary | ICD-10-CM | POA: Diagnosis present

## 2019-02-12 DIAGNOSIS — K7460 Unspecified cirrhosis of liver: Secondary | ICD-10-CM | POA: Diagnosis present

## 2019-02-12 DIAGNOSIS — R296 Repeated falls: Secondary | ICD-10-CM | POA: Diagnosis present

## 2019-02-12 DIAGNOSIS — E872 Acidosis: Secondary | ICD-10-CM | POA: Diagnosis present

## 2019-02-12 DIAGNOSIS — Z7982 Long term (current) use of aspirin: Secondary | ICD-10-CM

## 2019-02-12 DIAGNOSIS — M6282 Rhabdomyolysis: Secondary | ICD-10-CM | POA: Diagnosis present

## 2019-02-12 DIAGNOSIS — K7200 Acute and subacute hepatic failure without coma: Secondary | ICD-10-CM | POA: Diagnosis present

## 2019-02-12 DIAGNOSIS — S0083XA Contusion of other part of head, initial encounter: Secondary | ICD-10-CM | POA: Diagnosis present

## 2019-02-12 DIAGNOSIS — L89510 Pressure ulcer of right ankle, unstageable: Secondary | ICD-10-CM | POA: Diagnosis present

## 2019-02-12 DIAGNOSIS — Z888 Allergy status to other drugs, medicaments and biological substances status: Secondary | ICD-10-CM

## 2019-02-12 DIAGNOSIS — E871 Hypo-osmolality and hyponatremia: Secondary | ICD-10-CM | POA: Diagnosis present

## 2019-02-12 DIAGNOSIS — L89520 Pressure ulcer of left ankle, unstageable: Secondary | ICD-10-CM | POA: Diagnosis present

## 2019-02-12 DIAGNOSIS — F17210 Nicotine dependence, cigarettes, uncomplicated: Secondary | ICD-10-CM | POA: Diagnosis present

## 2019-02-12 DIAGNOSIS — D638 Anemia in other chronic diseases classified elsewhere: Secondary | ICD-10-CM | POA: Diagnosis present

## 2019-02-12 DIAGNOSIS — Z8249 Family history of ischemic heart disease and other diseases of the circulatory system: Secondary | ICD-10-CM

## 2019-02-12 DIAGNOSIS — W19XXXA Unspecified fall, initial encounter: Secondary | ICD-10-CM | POA: Diagnosis present

## 2019-02-12 DIAGNOSIS — Z79899 Other long term (current) drug therapy: Secondary | ICD-10-CM

## 2019-02-12 DIAGNOSIS — I959 Hypotension, unspecified: Secondary | ICD-10-CM | POA: Diagnosis present

## 2019-02-12 DIAGNOSIS — Y92009 Unspecified place in unspecified non-institutional (private) residence as the place of occurrence of the external cause: Secondary | ICD-10-CM

## 2019-02-12 DIAGNOSIS — F1410 Cocaine abuse, uncomplicated: Secondary | ICD-10-CM | POA: Diagnosis present

## 2019-02-12 DIAGNOSIS — E43 Unspecified severe protein-calorie malnutrition: Secondary | ICD-10-CM | POA: Diagnosis present

## 2019-02-12 DIAGNOSIS — Z23 Encounter for immunization: Secondary | ICD-10-CM

## 2019-02-12 DIAGNOSIS — K7010 Alcoholic hepatitis without ascites: Principal | ICD-10-CM | POA: Diagnosis present

## 2019-02-12 DIAGNOSIS — D6959 Other secondary thrombocytopenia: Secondary | ICD-10-CM | POA: Diagnosis present
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
## 2019-02-13 ENCOUNTER — Emergency Department: Payer: Self-pay

## 2019-02-13 ENCOUNTER — Inpatient Hospital Stay
Admission: EM | Admit: 2019-02-13 | Discharge: 2019-02-19 | DRG: 432 | Disposition: A | Discharge status: Home / Self Care | Payer: Self-pay | Attending: Family Medicine | Admitting: Family Medicine

## 2019-02-13 DIAGNOSIS — D696 Thrombocytopenia, unspecified: Secondary | ICD-10-CM | POA: Diagnosis present

## 2019-02-13 DIAGNOSIS — E871 Hypo-osmolality and hyponatremia: Secondary | ICD-10-CM | POA: Diagnosis present

## 2019-02-13 DIAGNOSIS — I959 Hypotension, unspecified: Secondary | ICD-10-CM | POA: Diagnosis present

## 2019-02-13 DIAGNOSIS — K7200 Acute and subacute hepatic failure without coma: Secondary | ICD-10-CM

## 2019-02-13 DIAGNOSIS — E872 Acidosis, unspecified: Secondary | ICD-10-CM | POA: Diagnosis present

## 2019-02-13 DIAGNOSIS — M6282 Rhabdomyolysis: Secondary | ICD-10-CM | POA: Diagnosis present

## 2019-02-13 DIAGNOSIS — T796XXA Traumatic ischemia of muscle, initial encounter: Secondary | ICD-10-CM

## 2019-02-13 DIAGNOSIS — K7010 Alcoholic hepatitis without ascites: Principal | ICD-10-CM

## 2019-02-13 DIAGNOSIS — E162 Hypoglycemia, unspecified: Secondary | ICD-10-CM | POA: Diagnosis present

## 2019-02-13 DIAGNOSIS — R29898 Other symptoms and signs involving the musculoskeletal system: Secondary | ICD-10-CM

## 2019-02-13 HISTORY — DX: Anemia, unspecified: D64.9

## 2019-02-13 HISTORY — DX: Polyneuropathy, unspecified: G62.9

## 2019-02-13 LAB — CBC
HCT: 34.3 % — ABNORMAL LOW (ref 36.0–46.0)
Hemoglobin: 10 g/dL — ABNORMAL LOW (ref 12.0–15.0)
MCH: 28.3 pg (ref 26.0–34.0)
MCHC: 29.2 g/dL — ABNORMAL LOW (ref 30.0–36.0)
MCV: 97.2 fL (ref 80.0–100.0)
Platelets: 95 10*3/uL — ABNORMAL LOW (ref 150–400)
RBC: 3.53 MIL/uL — ABNORMAL LOW (ref 3.87–5.11)
RDW: 23.8 % — ABNORMAL HIGH (ref 11.5–15.5)
WBC: 10.1 10*3/uL (ref 4.0–10.5)
nRBC: 0.7 % — ABNORMAL HIGH (ref 0.0–0.2)

## 2019-02-13 LAB — URINALYSIS, COMPLETE (UACMP) WITH MICROSCOPIC
Bilirubin Urine: NEGATIVE
Glucose, UA: NEGATIVE mg/dL
Ketones, ur: 20 mg/dL — AB
Leukocytes,Ua: NEGATIVE
Nitrite: NEGATIVE
Protein, ur: 100 mg/dL — AB
Specific Gravity, Urine: 1.011 (ref 1.005–1.030)
pH: 6 (ref 5.0–8.0)

## 2019-02-13 LAB — GLUCOSE, CAPILLARY
Glucose-Capillary: 146 mg/dL — ABNORMAL HIGH (ref 70–99)
Glucose-Capillary: 36 mg/dL — CL (ref 70–99)
Glucose-Capillary: 102 mg/dL — ABNORMAL HIGH (ref 70–99)
Glucose-Capillary: 148 mg/dL — ABNORMAL HIGH (ref 70–99)

## 2019-02-13 LAB — COMPREHENSIVE METABOLIC PANEL
ALT: 313 U/L — ABNORMAL HIGH (ref 0–44)
AST: 1138 U/L — ABNORMAL HIGH (ref 15–41)
Albumin: 2.5 g/dL — ABNORMAL LOW (ref 3.5–5.0)
Alkaline Phosphatase: 1026 U/L — ABNORMAL HIGH (ref 38–126)
Anion gap: 25 — ABNORMAL HIGH (ref 5–15)
BUN: 6 mg/dL (ref 6–20)
CO2: 17 mmol/L — ABNORMAL LOW (ref 22–32)
Calcium: 8.2 mg/dL — ABNORMAL LOW (ref 8.9–10.3)
Chloride: 87 mmol/L — ABNORMAL LOW (ref 98–111)
Creatinine, Ser: 0.49 mg/dL (ref 0.44–1.00)
GFR calc Af Amer: >60 mL/min (ref >60)
GFR calc non Af Amer: >60 mL/min (ref >60)
Glucose, Bld: 35 mg/dL — CL (ref 70–99)
Potassium: 4.1 mmol/L (ref 3.5–5.1)
Sodium: 129 mmol/L — ABNORMAL LOW (ref 135–145)
Total Bilirubin: 9.7 mg/dL — ABNORMAL HIGH (ref 0.3–1.2)
Total Protein: 6.1 g/dL — ABNORMAL LOW (ref 6.5–8.1)

## 2019-02-13 LAB — PHOSPHORUS: Phosphorus: 4.6 mg/dL (ref 2.5–4.6)

## 2019-02-13 LAB — URINE DRUG SCREEN, QUALITATIVE (ARMC ONLY)
Amphetamines, Ur Screen: NOT DETECTED
Barbiturates, Ur Screen: NOT DETECTED
Benzodiazepine, Ur Scrn: NOT DETECTED
Cannabinoid 50 Ng, Ur ~~LOC~~: NOT DETECTED
Cocaine Metabolite,Ur ~~LOC~~: POSITIVE — AB
MDMA (Ecstasy)Ur Screen: NOT DETECTED
Methadone Scn, Ur: NOT DETECTED
Opiate, Ur Screen: POSITIVE — AB
Phencyclidine (PCP) Ur S: NOT DETECTED
Tricyclic, Ur Screen: NOT DETECTED

## 2019-02-13 LAB — MAGNESIUM: Magnesium: 2.4 mg/dL (ref 1.7–2.4)

## 2019-02-13 LAB — ACETAMINOPHEN LEVEL: Acetaminophen (Tylenol), Serum: <10 ug/mL — ABNORMAL LOW (ref 10–30)

## 2019-02-13 LAB — APTT: aPTT: 29 seconds (ref 24–36)

## 2019-02-13 LAB — LACTIC ACID, PLASMA
Lactic Acid, Venous: 3.7 mmol/L (ref 0.5–1.9)
Lactic Acid, Venous: 2 mmol/L (ref 0.5–1.9)

## 2019-02-13 LAB — PROTIME-INR
INR: 1.1 (ref 0.8–1.2)
Prothrombin Time: 14 seconds (ref 11.4–15.2)

## 2019-02-13 LAB — SARS CORONAVIRUS 2 (TAT 6-24 HRS): SARS Coronavirus 2: NEGATIVE

## 2019-02-13 LAB — CK: Total CK: 25608 U/L — ABNORMAL HIGH (ref 38–234)

## 2019-02-13 LAB — POCT PREGNANCY, URINE: Preg Test, Ur: NEGATIVE

## 2019-02-13 LAB — AMMONIA: Ammonia: 78 umol/L — ABNORMAL HIGH (ref 9–35)

## 2019-02-13 MED ORDER — ACETAMINOPHEN 650 MG RE SUPP: 650.0000 mg | Freq: Three times a day (TID) | RECTAL | Status: DC | PRN

## 2019-02-13 MED ORDER — FLUOXETINE HCL 20 MG PO CAPS
40.0000 mg | ORAL_CAPSULE | Freq: Every day | ORAL | Status: DC
  Administered 2019-02-13: 40 mg via ORAL
  Filled 2019-02-13: qty 2

## 2019-02-13 MED ORDER — DEXTROSE 50 % IV SOLN
INTRAVENOUS | Status: AC
  Filled 2019-02-13: qty 50

## 2019-02-13 MED ORDER — POLYETHYLENE GLYCOL 3350 17 G PO PACK: 17.0000 g | PACK | Freq: Every day | ORAL | Status: DC | PRN

## 2019-02-13 MED ORDER — SODIUM CHLORIDE 0.9 % IV SOLN
2.0000 g | Freq: Once | INTRAVENOUS | Status: AC
  Administered 2019-02-13: 2 g via INTRAVENOUS
  Filled 2019-02-13: qty 2

## 2019-02-13 MED ORDER — ACETAMINOPHEN 325 MG PO TABS
650.0000 mg | ORAL_TABLET | Freq: Three times a day (TID) | ORAL | Status: DC | PRN
  Administered 2019-02-13 – 2019-02-14 (×2): 650 mg via ORAL
  Filled 2019-02-13 (×2): qty 2

## 2019-02-13 MED ORDER — CHLORHEXIDINE GLUCONATE CLOTH 2 % EX PADS
6.0000 | MEDICATED_PAD | Freq: Every day | CUTANEOUS | Status: DC
  Administered 2019-02-14 – 2019-02-15 (×2): 6 via TOPICAL

## 2019-02-13 MED ORDER — SODIUM CHLORIDE 0.9 % IV BOLUS
1000.0000 mL | Freq: Once | INTRAVENOUS | Status: AC
  Administered 2019-02-13: 1000 mL via INTRAVENOUS

## 2019-02-13 MED ORDER — LACTULOSE 10 GM/15ML PO SOLN
10.0000 g | Freq: Two times a day (BID) | ORAL | Status: DC
  Administered 2019-02-13 – 2019-02-15 (×5): 10 g via ORAL
  Filled 2019-02-13 (×9): qty 30

## 2019-02-13 MED ORDER — ADULT MULTIVITAMIN W/MINERALS CH
1.0000 | ORAL_TABLET | Freq: Every day | ORAL | Status: DC
  Administered 2019-02-13 – 2019-02-19 (×7): 1 via ORAL
  Filled 2019-02-13 (×7): qty 1

## 2019-02-13 MED ORDER — DEXTROSE-NACL 5-0.9 % IV SOLN: INTRAVENOUS | Status: DC

## 2019-02-13 MED ORDER — METRONIDAZOLE IN NACL 5-0.79 MG/ML-% IV SOLN
500.0000 mg | Freq: Once | INTRAVENOUS | Status: AC
  Administered 2019-02-13: 500 mg via INTRAVENOUS
  Filled 2019-02-13: qty 100

## 2019-02-13 MED ORDER — LORAZEPAM 1 MG PO TABS: 1.0000 mg | ORAL_TABLET | ORAL | Status: DC | PRN

## 2019-02-13 MED ORDER — THIAMINE HCL 100 MG/ML IJ SOLN
100.0000 mg | Freq: Every day | INTRAMUSCULAR | Status: DC
  Administered 2019-02-13: 100 mg via INTRAVENOUS
  Filled 2019-02-13 (×2): qty 2

## 2019-02-13 MED ORDER — KETOROLAC TROMETHAMINE 30 MG/ML IJ SOLN
30.0000 mg | Freq: Once | INTRAMUSCULAR | Status: AC
  Administered 2019-02-13: 30 mg via INTRAVENOUS
  Filled 2019-02-13: qty 1

## 2019-02-13 MED ORDER — HYDROXYZINE HCL 10 MG PO TABS
25.0000 mg | ORAL_TABLET | Freq: Two times a day (BID) | ORAL | Status: DC | PRN
  Filled 2019-02-13: qty 3

## 2019-02-13 MED ORDER — PNEUMOCOCCAL VAC POLYVALENT 25 MCG/0.5ML IJ INJ
0.5000 mL | INJECTION | INTRAMUSCULAR | Status: AC
  Administered 2019-02-19: 0.5 mL via INTRAMUSCULAR
  Filled 2019-02-13: qty 0.5

## 2019-02-13 MED ORDER — INFLUENZA VAC SPLIT QUAD 0.5 ML IM SUSY
0.5000 mL | PREFILLED_SYRINGE | INTRAMUSCULAR | Status: AC
  Administered 2019-02-19: 0.5 mL via INTRAMUSCULAR
  Filled 2019-02-13: qty 0.5

## 2019-02-13 MED ORDER — ONDANSETRON HCL 4 MG/2ML IJ SOLN
4.0000 mg | Freq: Four times a day (QID) | INTRAMUSCULAR | Status: DC | PRN
  Administered 2019-02-15 – 2019-02-16 (×2): 4 mg via INTRAVENOUS
  Filled 2019-02-13 (×2): qty 2

## 2019-02-13 MED ORDER — IOHEXOL 300 MG/ML  SOLN
100.0000 mL | Freq: Once | INTRAMUSCULAR | Status: AC | PRN
  Administered 2019-02-13: 100 mL via INTRAVENOUS

## 2019-02-13 MED ORDER — VANCOMYCIN HCL IN DEXTROSE 1-5 GM/200ML-% IV SOLN
1000.0000 mg | Freq: Once | INTRAVENOUS | Status: AC
  Administered 2019-02-13: 1000 mg via INTRAVENOUS
  Filled 2019-02-13: qty 200

## 2019-02-13 MED ORDER — THIAMINE HCL 100 MG PO TABS
100.0000 mg | ORAL_TABLET | Freq: Every day | ORAL | Status: DC
  Administered 2019-02-14 – 2019-02-19 (×6): 100 mg via ORAL
  Filled 2019-02-13 (×7): qty 1

## 2019-02-13 MED ORDER — SODIUM CHLORIDE 0.9 % IV SOLN: INTRAVENOUS | Status: DC

## 2019-02-13 MED ORDER — FOLIC ACID 1 MG PO TABS
1.0000 mg | ORAL_TABLET | Freq: Every day | ORAL | Status: DC
  Administered 2019-02-13 – 2019-02-19 (×7): 1 mg via ORAL
  Filled 2019-02-13 (×7): qty 1

## 2019-02-13 MED ORDER — LORAZEPAM 2 MG/ML IJ SOLN: 1.0000 mg | INTRAMUSCULAR | Status: DC | PRN

## 2019-02-13 MED ORDER — ONDANSETRON HCL 4 MG PO TABS: 4.0000 mg | ORAL_TABLET | Freq: Four times a day (QID) | ORAL | Status: DC | PRN

## 2019-02-13 NOTE — Consult Note (Signed)
PHARMACY -  BRIEF ANTIBIOTIC NOTE   Pharmacy has received consult(s) for Vancomycin/Cefepime from an ED provider.  The patient's profile has been reviewed for ht/wt/allergies/indication/available labs.    One time order(s)placed for Cefepime 2g x 1 and Vancomycin1g x 1  Further antibiotics/pharmacy consults should be ordered by admitting physician if indicated.                       Thank you,  Albina Billet, PharmD, BCPS Clinical Pharmacist 02/13/2019 4:18 PM

## 2019-02-13 NOTE — ED Notes (Signed)
Patient was incontinent of medium, brown, soft stool. Foley catheter in place, draining tea-colored urine. Stat lok was broken, so Foley was taped to thigh. Patient was a full-assist to turn. Patient could provide only minimal assist with turning upper body and no assist with repositioning bilateral lower extremities.

## 2019-02-13 NOTE — ED Notes (Signed)
Patient transported to MRI 

## 2019-02-13 NOTE — ED Notes (Signed)
Foley collection bag changed due to prior bag leaking. Pt with small amount of stool, pt cleaned and brief changed. Pt able to roll side to side with moderate assist. Gown and blankets changed due to pt spilling juice.

## 2019-02-13 NOTE — Progress Notes (Signed)
PHARMACY -  BRIEF ANTIBIOTIC NOTE   Pharmacy has received consult(s) for Aztreonam and Vancomycin from an ED provider.  The patient's profile has been reviewed for ht/wt/allergies/indication/available labs.    One time order(s) placed for Aztreonam and Vancomycin by ED provider  Further antibiotics/pharmacy consults should be ordered by admitting physician if indicated.                       Thank you, Foye Deer 02/13/2019  3:12 AM

## 2019-02-13 NOTE — ED Notes (Signed)
Lunch tray at bedside. ?

## 2019-02-13 NOTE — ED Notes (Signed)
Patient had small brown bowel movement. Patient was cleaned and brief was changed. Patient was repositioned on stretcher. Meal at bedside. Patient c/o leg pain and Dr. Myriam Forehand gave permission for patient to have tylenol as ordered for pain.

## 2019-02-13 NOTE — ED Notes (Signed)
Attempted to call report, but RN not available.

## 2019-02-13 NOTE — Social Work (Deleted)
Social worker called the patient's sisters.  Marie Roberts, sister [phone] Left message Thelma Clark, sister [phone] Left message     Apryl Brymer I. Rayce Brahmbhatt, MSW, LCSW  [phone] 8am-5:30pm  

## 2019-02-13 NOTE — Progress Notes (Signed)
CODE SEPSIS - PHARMACY COMMUNICATION  **Broad Spectrum Antibiotics should be administered within 1 hour of Sepsis diagnosis**  Time Code Sepsis Called/Page Received: 02:46  Antibiotics Ordered: Aztreonam and Vancomycin  Time of 1st antibiotic administration: Aztreonam given at 03:26  Additional action taken by pharmacy: n/a  If necessary, Name of Provider/Nurse Contacted: n/a   Foye Deer ,PharmD Clinical Pharmacist  02/13/2019  3:34 AM

## 2019-02-13 NOTE — Progress Notes (Signed)
PT Cancellation Note  Patient Details Name: Ellen Smith MRN: [id number] DOB: 30-Jun-1975   Cancelled Treatment:    Reason Eval/Treat Not Completed: Patient declined, no reason specified;Other (comment)(Patient consult received and reviewed. Consulted with nursing and case management. Upon entering room patient declined PT eval requesting it to be done later due to pain. Will continue to monitor patient and attempt again at later time/date.)  Precious Bard, PT, DPT   02/13/2019, 2:39 PM

## 2019-02-13 NOTE — Consult Note (Signed)
Cephas Darby, MD [address]  [place]  [place], [place] [address]  Main: [phone]  Fax: [phone] Pager: [phone]   Consultation  Referring Provider:     No ref. provider found Primary Care Physician:  Patient, No Pcp Per Primary Gastroenterologist: Althia Forts    Reason for Consultation:   Acute alcoholic hepatitis  Date of Admission:  02/13/2019 Date of Consultation:  02/13/2019         HPI:   Ellen Smith is a 44 y.o. female with history of heavy alcohol abuse, polysubstance drug abuse presented to ER with worsening of generalized weakness and body pains.  She has severe left leg pain, which has gotten worse associated with bilateral swelling of legs.  She has limited mobility, restricted at home.  She lives with her fianc.  Patient acknowledges that she has been drinking 12 to 15 cans of beer daily for last 2 years since her father passed away.  She also reports having multiple falls at home due to limited mobility, resulted in severe bruising in various parts of her body.  She is also tested positive for cocaine today.  She is medically stable, labs revealed total CK 25 608, PT/INR normal, serum lactate 3.7, AST 1138, ALT 313, alkaline phosphatase 1026, total bilirubin 9.7, hemoglobin 10, platelets 95.  GI is therefore consulted for acute liver failure.  Imaging revealed diffuse hepatic steatosis, with no evidence of portal hypertension, she had history of gastric bypass Patient denies abdominal pain, nausea, vomiting, melena, rectal bleeding, fever, chills.  She does smoke cigarettes, poor p.o. intake, decreased appetite   NSAIDs: Goody powders for headaches  Antiplts/Anticoagulants/Anti thrombotics: None  GI Procedures: None  Past Medical History:  Diagnosis Date  . Alcohol abuse   . Anemia   . Neuropathy     Past Surgical History:  Procedure Laterality Date  . GASTRIC BYPASS      Prior to Admission medications   Medication Sig Start Date End  Date Taking? Authorizing Provider  Aspirin-Acetaminophen-Caffeine (GOODY HEADACHE PO) Take 1 packet by mouth every 4 (four) hours as needed.    [provider]  diphenhydrAMINE (BENADRYL) 25 mg capsule Take 25 mg by mouth every 6 (six) hours as needed for allergies.    [provider]  FLUoxetine (PROZAC) 40 MG capsule Take 40 mg by mouth daily.    [provider]  hydrOXYzine (VISTARIL) 50 MG capsule Take 25 mg by mouth 3 (three) times daily as needed.     [provider]  naloxone [facility]) nasal spray 4 mg/0.1 mL To use in case of opioid overdose and difficulty breathing 09/01/18   Nance Pear, MD    Current Facility-Administered Medications:  .  acetaminophen (TYLENOL) tablet 650 mg, 650 mg, Oral, Q8H PRN **OR** acetaminophen (TYLENOL) suppository 650 mg, 650 mg, Rectal, Q8H PRN, Jennye Boroughs, MD .  dextrose 5 %-0.9 % sodium chloride infusion, , Intravenous, Continuous, Jennye Boroughs, MD, Last Rate: 125 mL/hr at 02/13/19 0941, New Bag at 02/13/19 0941 .  dextrose 50 % solution, , , ,  .  FLUoxetine (PROZAC) capsule 40 mg, 40 mg, Oral, Daily, Jennye Boroughs, MD, 40 mg at 02/13/19 1136 .  folic acid (FOLVITE) tablet 1 mg, 1 mg, Oral, Daily, Jennye Boroughs, MD, 1 mg at 02/13/19 1139 .  hydrOXYzine (ATARAX/VISTARIL) tablet 25 mg, 25 mg, Oral, BID PRN, Jennye Boroughs, MD .  lactulose (CHRONULAC) 10 GM/15ML solution 10 g, 10 g, Oral, BID, Jennye Boroughs, MD, 10  g at 02/13/19 1135 .  LORazepam (ATIVAN) tablet 1-4 mg, 1-4 mg, Oral, Q1H PRN **OR** LORazepam (ATIVAN) injection 1-4 mg, 1-4 mg, Intravenous, Q1H PRN, Jennye Boroughs, MD .  multivitamin with minerals tablet 1 tablet, 1 tablet, Oral, Daily, Jennye Boroughs, MD, 1 tablet at 02/13/19 1139 .  ondansetron (ZOFRAN) tablet 4 mg, 4 mg, Oral, Q6H PRN **OR** ondansetron (ZOFRAN) injection 4 mg, 4 mg, Intravenous, Q6H PRN, Jennye Boroughs, MD .  polyethylene glycol (MIRALAX / GLYCOLAX) packet 17 g, 17 g, Oral,  Daily PRN, Jennye Boroughs, MD .  thiamine tablet 100 mg, 100 mg, Oral, Daily **OR** thiamine (B-1) injection 100 mg, 100 mg, Intravenous, Daily, Jennye Boroughs, MD, 100 mg at 02/13/19 1136  Current Outpatient Medications:  .  Aspirin-Acetaminophen-Caffeine (GOODY HEADACHE PO), Take 1 packet by mouth every 4 (four) hours as needed., Disp: , Rfl:  .  diphenhydrAMINE (BENADRYL) 25 mg capsule, Take 25 mg by mouth every 6 (six) hours as needed for allergies., Disp: , Rfl:  .  FLUoxetine (PROZAC) 40 MG capsule, Take 40 mg by mouth daily., Disp: , Rfl:  .  hydrOXYzine (VISTARIL) 50 MG capsule, Take 25 mg by mouth 3 (three) times daily as needed. , Disp: , Rfl:  .  naloxone (NARCAN) nasal spray 4 mg/0.1 mL, To use in case of opioid overdose and difficulty breathing, Disp: 1 each, Rfl: 1  Family History  Problem Relation Age of Onset  . Hypertension Mother      Social History   Tobacco Use  . Smoking status: Current Every Day Smoker    Packs/day: 1.00    Years: 20.00    Pack years: 20.00    Types: Cigarettes  . Smokeless tobacco: Never Used  Substance Use Topics  . Alcohol use: Yes    Alcohol/week: 12.0 standard drinks    Types: 12 Cans of beer per week  . Drug use: Not Currently    Allergies as of 02/12/2019 - Review Complete 09/03/2018  Allergen Reaction Noted  . Cefaclor Hives 06/24/2016    Review of Systems:    All systems reviewed and negative except where noted in HPI.   Physical Exam:  Vital signs in last 24 hours: Temp:  [97.5 F (36.4 C)] 97.5 F (36.4 C) (01/09 0754) Pulse Rate:  [73-91] 89 (01/09 1100) Resp:  [14-20] 14 (01/09 1100) BP: (112-151)/(36-73) 127/69 (01/09 1100) SpO2:  [0 %-100 %] 100 % (01/09 1100) Weight:  [68 kg] 68 kg (01/08 2338)   General: Ill groomed, ill-appearing, lethargic  head:  Normocephalic and atraumatic. Eyes: Black eyes, jaundice, Conjunctiva pink. PERRLA. Ears:  Normal auditory acuity. Neck:  Supple; no masses or  thyroidomegaly Lungs: Respirations even and unlabored. Lungs clear to auscultation bilaterally.   No wheezes, crackles, or rhonchi.  Heart:  Regular rate and rhythm;  Without murmur, clicks, rubs or gallops Abdomen:  Soft, nondistended, nontender. Normal bowel sounds. No appreciable masses or hepatomegaly.  No rebound or guarding.  Rectal:  Not performed. Msk:  Symmetrical without Dorian deformities.  Generalized weakness Extremities: 3+ edema, no cyanosis or clubbing. Neurologic:  Alert and oriented x3;  grossly normal neurologically. Skin: Several areas of bruising on her face, upper chest Psych:  Alert and cooperative.  LAB RESULTS: CBC Latest Ref Rng & Units 02/13/2019 09/04/2018 09/03/2018  WBC 4.0 - 10.5 K/uL 10.1 5.5 7.8  Hemoglobin 12.0 - 15.0 g/dL 10.0(L) 8.7(L) 8.8(L)  Hematocrit 36.0 - 46.0 % 34.3(L) 29.6(L) 29.8(L)  Platelets 150 - 400 K/uL 95(L) 155  168    BMET BMP Latest Ref Rng & Units 02/13/2019 09/04/2018 09/03/2018  Glucose 70 - 99 mg/dL 35(LL) 88 92  BUN 6 - 20 mg/dL 6 <5(L) <5(L)  Creatinine 0.44 - 1.00 mg/dL 0.49 <0.30(L) <0.30(L)  Sodium 135 - 145 mmol/L 129(L) 135 133(L)  Potassium 3.5 - 5.1 mmol/L 4.1 4.0 4.2  Chloride 98 - 111 mmol/L 87(L) 100 100  CO2 22 - 32 mmol/L 17(L) 27 27  Calcium 8.9 - 10.3 mg/dL 8.2(L) 8.7(L) 8.5(L)    LFT Hepatic Function Latest Ref Rng & Units 02/13/2019 09/03/2018 09/02/2018  Total Protein 6.5 - 8.1 g/dL 6.1(L) 6.8 7.5  Albumin 3.5 - 5.0 g/dL 2.5(L) 3.1(L) 3.6  AST 15 - 41 U/L 1,138(H) 80(H) 155(H)  ALT 0 - 44 U/L 313(H) 41 54(H)  Alk Phosphatase 38 - 126 U/L 1,026(H) 325(H) 483(H)  Total Bilirubin 0.3 - 1.2 mg/dL 9.7(H) 0.5 0.9  Bilirubin, Direct 0.0 - 0.2 mg/dL - 0.2 -     STUDIES: CT Head Wo Contrast  Result Date: 02/13/2019 CLINICAL DATA:  Weakness to the left side EXAM: CT HEAD WITHOUT CONTRAST TECHNIQUE: Contiguous axial images were obtained from the base of the skull through the vertex without intravenous contrast.  COMPARISON:  March 06, 2018 FINDINGS: Brain: No evidence of acute territorial infarction, hemorrhage, hydrocephalus,extra-axial collection or mass lesion/mass effect. Normal gray-white differentiation. Ventricles are normal in size and contour. Vascular: No hyperdense vessel or unexpected calcification. Skull: The skull is intact. No fracture or focal lesion identified. Sinuses/Orbits: The visualized paranasal sinuses and mastoid air cells are clear. The orbits and globes intact. Other: None IMPRESSION: No acute intracranial abnormality. Electronically Signed   By: Prudencio Pair M.D.   On: 02/13/2019 01:46   MR BRAIN WO CONTRAST  Result Date: 02/13/2019 CLINICAL DATA:  New left-sided weakness EXAM: MRI HEAD WITHOUT CONTRAST TECHNIQUE: Multiplanar, multiecho pulse sequences of the brain and surrounding structures were obtained without intravenous contrast. COMPARISON:  Head CT 02/13/2019 FINDINGS: BRAIN: There is no acute infarct, acute hemorrhage or extra-axial collection. The white matter signal is normal for the patient's age. The cerebral and cerebellar volume are age-appropriate. There is no hydrocephalus. The midline structures are normal. VASCULAR: The major intracranial arterial and venous sinus flow voids are normal. Susceptibility-sensitive sequences show no chronic microhemorrhage or superficial siderosis. SKULL AND UPPER CERVICAL SPINE: Calvarial bone marrow signal is normal. There is no skull base mass. The visualized upper cervical spine and soft tissues are normal. SINUSES/ORBITS: There are no fluid levels or advanced mucosal thickening. The mastoid air cells and middle ear cavities are free of fluid. The orbits are normal. IMPRESSION: Normal MRI of the brain. Electronically Signed   By: Ulyses Jarred M.D.   On: 02/13/2019 04:32   CT ABDOMEN PELVIS W CONTRAST  Result Date: 02/13/2019 CLINICAL DATA:  Decreased mobility for 3 months on the right side. New weakness on the left side. Acute liver  failure. EXAM: CT ABDOMEN AND PELVIS WITH CONTRAST TECHNIQUE: Multidetector CT imaging of the abdomen and pelvis was performed using the standard protocol following bolus administration of intravenous contrast. CONTRAST:  125m OMNIPAQUE IOHEXOL 300 MG/ML  SOLN COMPARISON:  None. FINDINGS: Lower chest: Unremarkable. Hepatobiliary: The liver shows diffusely decreased attenuation suggesting fat deposition. Nonvisualization of gallbladder suggest prior cholecystectomy. No intrahepatic or extrahepatic biliary dilation. 6 mm calcification noted in the porta hepatis. Pancreas: No focal mass lesion. No dilatation of the main duct. No intraparenchymal cyst. No peripancreatic edema. Spleen: No splenomegaly. No focal  mass lesion. Adrenals/Urinary Tract: No adrenal nodule or mass. Kidneys unremarkable. No contrast excretion from the kidneys on the 2 minutes renal delay series, raising the question of dysfunction. No evidence for hydroureter. The urinary bladder appears normal for the degree of distention. Stomach/Bowel: Surgical changes consistent with gastric bypass. No small bowel wall thickening. No small bowel dilatation. The terminal ileum is normal. The appendix is best seen on coronal images and is unremarkable. No A[place]man colonic mass. No colonic wall thickening. Vascular/Lymphatic: No abdominal aortic aneurysm. No abdominal lymphadenopathy No pelvic sidewall lymphadenopathy. Reproductive: IUD visualized in the uterus. There is no adnexal mass. Other: No intraperitoneal free fluid. Musculoskeletal: Body wall edema noted diffusely. No worrisome lytic or sclerotic osseous abnormality. IMPRESSION: 1. Hepatic steatosis. No intra or extrahepatic biliary duct dilatation. 2. No renal mass or hydronephrosis. Kidneys are not excreting contrast on the 2 minutes renal delay series raising the question of underlying renal dysfunction. 3. Diffuse body wall edema Electronically Signed   By: Misty Stanley M.D.   On: 02/13/2019 05:52    CT L-SPINE NO CHARGE  Result Date: 02/13/2019 CLINICAL DATA:  New left-sided weakness EXAM: CT LUMBAR SPINE WITHOUT CONTRAST TECHNIQUE: Multidetector CT imaging of the lumbar spine was performed without intravenous contrast administration. Multiplanar CT image reconstructions were also generated. COMPARISON:  None. FINDINGS: Segmentation: 5 lumbar type vertebrae. Alignment: Normal. Vertebrae: No acute fracture or focal pathologic process. Paraspinal and other soft tissues: Hepatic steatosis Disc levels: There is no spinal canal stenosis or neural impingement. IMPRESSION: 1. No acute abnormality of the lumbar spine. 2. Hepatic steatosis. Electronically Signed   By: Ulyses Jarred M.D.   On: 02/13/2019 05:33   DG Chest Port 1 View  Result Date: 02/13/2019 CLINICAL DATA:  Weakness EXAM: PORTABLE CHEST 1 VIEW COMPARISON:  September 03, 2018 FINDINGS: The heart size and mediastinal contours are within normal limits. Both lungs are clear. The visualized skeletal structures are unremarkable. IMPRESSION: No active disease. Electronically Signed   By: Prudencio Pair M.D.   On: 02/13/2019 03:21      Impression / Plan:   Arnesha Schiraldi is a 44 y.o. female with history of gastric bypass, polysubstance drug abuse, heavy alcohol use who is admitted with rhabdomyolysis, acute alcoholic hepatitis  Elevated LFTs: Combination of rhabdomyolysis and acute alcoholic hepatitis and cocaine use Cross-sectional imaging reveals severe hepatic steatosis without evidence of cirrhosis, There is no evidence of hepatic or portal vein thrombosis or biliary obstruction Discriminant function score 4.2, does not meet the criteria for initiation of prednisolone Recommend adequate nutrition and aggressive hydration Avoid hepatotoxic agents Monitor LFTs closely, recommend acute viral hepatitis panel, HIV Multivitamin plus thiamine plus folate Follow-up on blood cultures, UA negative for infection Abstinence from alcohol use    Thank you  for involving me in the care of this patient.  We will follow along with you    LOS: 0 days   Sherri Sear, MD  02/13/2019, 2:30 PM   Note: This dictation was prepared with Dragon dictation along with smaller phrase technology. Any transcriptional errors that result from this process are unintentional.

## 2019-02-13 NOTE — ED Notes (Signed)
Lab tech at bedside to draw labs

## 2019-02-13 NOTE — ED Provider Notes (Addendum)
[facility] Emergency Department Provider Note  ____________________________________________   First MD Initiated Contact with Patient 02/13/19 [phone]     (approximate)  I have reviewed the triage vital signs and the nursing notes.   HISTORY  Chief Complaint No chief complaint on file.    HPI Ellen Smith is a 44 y.o. female with below list of previous medical conditions including polysubstance abuse including alcohol presents emergency department secondary to inability to ambulate secondary to right leg weakness which patient states started today.  Patient states that she had a weakness on the left leg for the past 3 months however acute onset of right-sided weakness in the over the past 2 days.  Patient admits to multiple falls as a result with inability to ambulate at present.  Patient denies any nausea no vomiting diarrhea constipation.  Patient denies any pain at present.        Past Medical History:  Diagnosis Date  . Alcohol abuse   . Anemia   . Neuropathy     Patient Active Problem List   Diagnosis Date Noted  . Overdose 09/02/2018  . Alcohol dependence (HCC) 09/02/2018  . Opiate abuse, episodic (HCC) 09/02/2018  . MDD (major depressive disorder), severe (HCC) 09/02/2018    Past Surgical History:  Procedure Laterality Date  . GASTRIC BYPASS      Prior to Admission medications   Medication Sig Start Date End Date Taking? Authorizing Provider  Aspirin-Acetaminophen-Caffeine (GOODY HEADACHE PO) Take 1 packet by mouth every 4 (four) hours as needed.    [provider]  diphenhydrAMINE (BENADRYL) 25 mg capsule Take 25 mg by mouth every 6 (six) hours as needed for allergies.    [provider]  FLUoxetine (PROZAC) 40 MG capsule Take 40 mg by mouth daily.    [provider]  hydrOXYzine (VISTARIL) 50 MG capsule Take 25 mg by mouth 3 (three) times daily as needed.     [provider]  naloxone [facility]) nasal  spray 4 mg/0.1 mL To use in case of opioid overdose and difficulty breathing 09/01/18   Phineas Semen, MD    Allergies Cefaclor  Family History  Problem Relation Age of Onset  . Hypertension Mother     Social History Social History   Tobacco Use  . Smoking status: Current Every Day Smoker    Packs/day: 1.00    Years: 20.00    Pack years: 20.00    Types: Cigarettes  . Smokeless tobacco: Never Used  Substance Use Topics  . Alcohol use: Yes    Alcohol/week: 12.0 standard drinks    Types: 12 Cans of beer per week  . Drug use: Not Currently    Review of Systems Constitutional: No fever/chills Eyes: No visual changes. ENT: No sore throat. Cardiovascular: Denies chest pain. Respiratory: Denies shortness of breath. Gastrointestinal: No abdominal pain.  No nausea, no vomiting.  No diarrhea.  No constipation. Genitourinary: Negative for dysuria. Musculoskeletal: Negative for neck pain.  Negative for back pain. Integumentary: Negative for rash. Neurological: Negative for headaches, focal weakness or numbness.  Positive for right leg weakness psychiatric:   ____________________________________________   PHYSICAL EXAM:  VITAL SIGNS: ED Triage Vitals  Enc Vitals Group     BP 02/12/19 2337 (!) 151/36     Pulse Rate 02/12/19 2337 91     Resp 02/12/19 2337 18     Temp --      Temp src --      SpO2 02/12/19 2337 100 %  Weight 02/12/19 2338 68 kg (150 lb)     Height 02/12/19 2338 1.626 m (5\' 4" )     Head Circumference --      Peak Flow --      Pain Score 02/12/19 2338 0     Pain Loc --      Pain Edu? --      Excl. in GC? --     Constitutional: Alert and oriented. Eyes: Conjunctivae are normal.  Scleral icterus Head: Atraumatic. Mouth/Throat: Patient is wearing a mask. Neck: No stridor.  No meningeal signs.   Cardiovascular: Normal rate, regular rhythm. Good peripheral circulation. Grossly normal heart sounds. Respiratory: Normal respiratory effort.  No  retractions. Gastrointestinal: Soft and nontender. No distention.  Musculoskeletal: No lower extremity tenderness nor edema. No Cranfield deformities of extremities. Neurologic:  Normal speech and language. No Charney focal neurologic deficits are appreciated.  Skin: Diffuse contusions at different stages of healing including the patient's left periorbital region right chest wall bilateral arm and forearm.  Jaundice Psychiatric: Mood and affect are normal. Speech and behavior are normal.  ____________________________________________   LABS (all labs ordered are listed, but only abnormal results are displayed)  Labs Reviewed  CBC - Abnormal; Notable for the following components:      Result Value   RBC 3.53 (*)    Hemoglobin 10.0 (*)    HCT 34.3 (*)    MCHC 29.2 (*)    RDW 23.8 (*)    Platelets 95 (*)    nRBC 0.7 (*)    All other components within normal limits  COMPREHENSIVE METABOLIC PANEL - Abnormal; Notable for the following components:   Sodium 129 (*)    Chloride 87 (*)    CO2 17 (*)    Glucose, Bld 35 (*)    Calcium 8.2 (*)    Total Protein 6.1 (*)    Albumin 2.5 (*)    AST 1,138 (*)    ALT 313 (*)    Alkaline Phosphatase 1,026 (*)    Total Bilirubin 9.7 (*)    Anion gap 25 (*)    All other components within normal limits  LACTIC ACID, PLASMA - Abnormal; Notable for the following components:   Lactic Acid, Venous 3.7 (*)    All other components within normal limits  AMMONIA - Abnormal; Notable for the following components:   Ammonia 78 (*)    All other components within normal limits  URINALYSIS, COMPLETE (UACMP) WITH MICROSCOPIC - Abnormal; Notable for the following components:   Color, Urine AMBER (*)    APPearance HAZY (*)    Hgb urine dipstick LARGE (*)    Ketones, ur 20 (*)    Protein, ur 100 (*)    Bacteria, UA RARE (*)    All other components within normal limits  GLUCOSE, CAPILLARY - Abnormal; Notable for the following components:   Glucose-Capillary 36 (*)     All other components within normal limits  GLUCOSE, CAPILLARY - Abnormal; Notable for the following components:   Glucose-Capillary 146 (*)    All other components within normal limits  LACTIC ACID, PLASMA - Abnormal; Notable for the following components:   Lactic Acid, Venous 2.0 (*)    All other components within normal limits  CULTURE, BLOOD (ROUTINE X 2)  CULTURE, BLOOD (ROUTINE X 2)  URINE CULTURE  APTT  PROTIME-INR  CK  POCT PREGNANCY, URINE  POC URINE PREG, ED   ____________________________________________  EKG  ED ECG REPORT I, Garland N Climmie Buelow, the  attending physician, personally viewed and interpreted this ECG.   Date: 02/12/2019  EKG Time: 11:52 PM  Rate: 88  Rhythm: Normal sinus rhythm  Axis: Normal  Intervals: Normal  ST&T Change: None  ____________________________________________  RADIOLOGY I, [place] N Pastor Sgro, personally viewed and evaluated these images (plain radiographs) as part of my medical decision making, as well as reviewing the written report by the radiologist.  ED MD interpretation: CT had revealed no acute intracranial abnormality as does MRI of the brain CT L-spine revealed no acute abnormality.  Official radiology report(s): CT Head Wo Contrast  Result Date: 02/13/2019 CLINICAL DATA:  Weakness to the left side EXAM: CT HEAD WITHOUT CONTRAST TECHNIQUE: Contiguous axial images were obtained from the base of the skull through the vertex without intravenous contrast. COMPARISON:  March 06, 2018 FINDINGS: Brain: No evidence of acute territorial infarction, hemorrhage, hydrocephalus,extra-axial collection or mass lesion/mass effect. Normal gray-white differentiation. Ventricles are normal in size and contour. Vascular: No hyperdense vessel or unexpected calcification. Skull: The skull is intact. No fracture or focal lesion identified. Sinuses/Orbits: The visualized paranasal sinuses and mastoid air cells are clear. The orbits and globes intact.  Other: None IMPRESSION: No acute intracranial abnormality. Electronically Signed   By: Jonna Clark M.D.   On: 02/13/2019 01:46   MR BRAIN WO CONTRAST  Result Date: 02/13/2019 CLINICAL DATA:  New left-sided weakness EXAM: MRI HEAD WITHOUT CONTRAST TECHNIQUE: Multiplanar, multiecho pulse sequences of the brain and surrounding structures were obtained without intravenous contrast. COMPARISON:  Head CT 02/13/2019 FINDINGS: BRAIN: There is no acute infarct, acute hemorrhage or extra-axial collection. The white matter signal is normal for the patient's age. The cerebral and cerebellar volume are age-appropriate. There is no hydrocephalus. The midline structures are normal. VASCULAR: The major intracranial arterial and venous sinus flow voids are normal. Susceptibility-sensitive sequences show no chronic microhemorrhage or superficial siderosis. SKULL AND UPPER CERVICAL SPINE: Calvarial bone marrow signal is normal. There is no skull base mass. The visualized upper cervical spine and soft tissues are normal. SINUSES/ORBITS: There are no fluid levels or advanced mucosal thickening. The mastoid air cells and middle ear cavities are free of fluid. The orbits are normal. IMPRESSION: Normal MRI of the brain. Electronically Signed   By: Deatra Robinson M.D.   On: 02/13/2019 04:32   CT L-SPINE NO CHARGE  Result Date: 02/13/2019 CLINICAL DATA:  New left-sided weakness EXAM: CT LUMBAR SPINE WITHOUT CONTRAST TECHNIQUE: Multidetector CT imaging of the lumbar spine was performed without intravenous contrast administration. Multiplanar CT image reconstructions were also generated. COMPARISON:  None. FINDINGS: Segmentation: 5 lumbar type vertebrae. Alignment: Normal. Vertebrae: No acute fracture or focal pathologic process. Paraspinal and other soft tissues: Hepatic steatosis Disc levels: There is no spinal canal stenosis or neural impingement. IMPRESSION: 1. No acute abnormality of the lumbar spine. 2. Hepatic steatosis.  Electronically Signed   By: Deatra Robinson M.D.   On: 02/13/2019 05:33   DG Chest Port 1 View  Result Date: 02/13/2019 CLINICAL DATA:  Weakness EXAM: PORTABLE CHEST 1 VIEW COMPARISON:  September 03, 2018 FINDINGS: The heart size and mediastinal contours are within normal limits. Both lungs are clear. The visualized skeletal structures are unremarkable. IMPRESSION: No active disease. Electronically Signed   By: Jonna Clark M.D.   On: 02/13/2019 03:21    ____________________________________________   PROCEDURES   Procedure(s) performed (including Critical Care):  .Critical Care Performed by: Darci Current, MD Authorized by: Darci Current, MD   Critical care provider statement:  Critical care time (minutes):  45   Critical care time was exclusive of:  Separately billable procedures and treating other patients   Critical care was necessary to treat or prevent imminent or life-threatening deterioration of the following conditions:  Hepatic failure   Critical care was time spent personally by me on the following activities:  Development of treatment plan with patient or surrogate, discussions with consultants, evaluation of patient's response to treatment, examination of patient, obtaining history from patient or surrogate, ordering and performing treatments and interventions, ordering and review of laboratory studies, ordering and review of radiographic studies, pulse oximetry, re-evaluation of patient's condition and review of old charts     ____________________________________________   INITIAL IMPRESSION / MDM / ASSESSMENT AND PLAN / ED COURSE  As part of my medical decision making, I reviewed the following data within the electronic MEDICAL RECORD NUMBER  44 year old female presented with above-stated history and physical exam with concern for liver failure given jaundice laboratory data consistent with such.  Of note patient also has a CK of 25,608 for which patient is currently  receiving 2 L IV normal saline..  Drug screen positive for cocaine and opiates.  Lactic acid 3.7.  Patient noted to be hypoglycemic and as such was given D50 1 amp.  D5 saline also being given at a rate of 125/h with repeat glucose stable at 146  Patient discussed with hospitalist for hospital admission for further evaluation and management.  Hospitalist requested that I speak with gastroenterology to confirm that the patient could be cared for here at [facility].  As such patient discussed with Dr. Allegra Lai gastroenterology who confirmed that the patient could stay here at [facility] regional.  Dr. Allegra Lai states that she just spoke with the hospitalist and informed him as well. ____________________________________________  FINAL CLINICAL IMPRESSION(S) / ED DIAGNOSES  Final diagnoses:  Right leg weakness  Non-traumatic rhabdomyolysis  Acute liver failure without hepatic coma  Hypoglycemia     MEDICATIONS GIVEN DURING THIS VISIT:  Medications  metroNIDAZOLE (FLAGYL) IVPB 500 mg (has no administration in time range)  vancomycin (VANCOCIN) IVPB 1000 mg/200 mL premix (1,000 mg Intravenous New Bag/Given 02/13/19 0509)  dextrose 50 % solution (has no administration in time range)  aztreonam (AZACTAM) 2 g in sodium chloride 0.9 % 100 mL IVPB (0 g Intravenous Stopped 02/13/19 0542)  sodium chloride 0.9 % bolus 1,000 mL (1,000 mLs Intravenous New Bag/Given 02/13/19 0323)  sodium chloride 0.9 % bolus 1,000 mL (1,000 mLs Intravenous New Bag/Given 02/13/19 0508)  iohexol (OMNIPAQUE) 300 MG/ML solution 100 mL (100 mLs Intravenous Contrast Given 02/13/19 0425)     ED Discharge Orders    None      *Please note:  Manasa Spease was evaluated in Emergency Department on 02/13/2019 for the symptoms described in the history of present illness. She was evaluated in the context of the global COVID-19 pandemic, which necessitated consideration that the patient might be at risk for infection with the SARS-CoV-2 virus that  causes COVID-19. Institutional protocols and algorithms that pertain to the evaluation of patients at risk for COVID-19 are in a state of rapid change based on information released by regulatory bodies including the CDC and federal and state organizations. These policies and algorithms were followed during the patient's care in the ED.  Some ED evaluations and interventions may be delayed as a result of limited staffing during the pandemic.*  Note:  This document was prepared using Dragon voice recognition software and may include unintentional  dictation errors.   Gregor Hams, MD 02/13/19 [id number]    Gregor Hams, MD 02/13/19 [phone]

## 2019-02-13 NOTE — ED Notes (Signed)
Patient resting on bed, monitor SR, foley tea colored urine,. NS boluses cont. Patient alert and oriented though weak.

## 2019-02-13 NOTE — H&P (Addendum)
History and Physical:    Ellen Smith   UTM:[id number] DOB: 04/16/1975 DOA: 02/13/2019  Referring MD/provider: Bayard Males, MD PCP: Patient, No Pcp Per   Patient coming from: Home  Chief Complaint: Generalized weakness and body pains  History of Present Illness:   Ellen Smith is an 44 y.o. female with medical history significant for polysubstance abuse (opioids, cocaine, alcohol) chronic anemia, peripheral neuropathy, who presented to the hospital with generalized weakness and body pains.  She said she has had pain in her legs for about 3 months now.  Pain has gotten worse.  She noticed that the pain was more severe in the left leg.  She said she had to use a cane to walk because she also had some weakness in her left leg.  In the past 3 days, she noticed that the right leg had also gotten weaker and it got to a point where she could hardly walk.  She feels weak all over and she has also noticed swelling of the legs in the past few days.  She said she has had multiple falls at home that resulted in bruising of her skin.  She said she drinks about 12 cans of beer every day.  She said she drank about 6 cans of beer yesterday.  No nausea, vomiting, abdominal pain, diarrhea, chest pain, shortness of breath, cough, wheezing, headache, dizziness, loss of consciousness, fever or chills.    ED Course:  The patient is afebrile, not tachypneic or tachycardic.  Blood pressure initially was 151/36 but repeat BP was 91/59.  She was found to have significantly elevated liver enzymes, lactic acidosis, hyperammonemia but a normal INR.  Acetaminophen level was normal and CK was significantly elevated at 25,000.  CT head, MRI brain and CT lumbar spine did not show any acute abnormality.  She was given IV vancomycin, aztreonam, Flagyl and 3 L of normal saline in the emergency room  ROS:   ROS all other systems reviewed were negative  Past Medical History:   Past Medical History:  Diagnosis Date  . Alcohol  abuse   . Anemia   . Neuropathy     Past Surgical History:   Past Surgical History:  Procedure Laterality Date  . GASTRIC BYPASS      Social History:   Social History   Socioeconomic History  . Marital status: Single    Spouse name: Not on file  . Number of children: Not on file  . Years of education: Not on file  . Highest education level: Not on file  Occupational History  . Not on file  Tobacco Use  . Smoking status: Current Every Day Smoker    Packs/day: 1.00    Years: 20.00    Pack years: 20.00    Types: Cigarettes  . Smokeless tobacco: Never Used  Substance and Sexual Activity  . Alcohol use: Yes    Alcohol/week: 12.0 standard drinks    Types: 12 Cans of beer per week  . Drug use: Not Currently  . Sexual activity: Not on file  Other Topics Concern  . Not on file  Social History Narrative  . Not on file   Social Determinants of Health   Financial Resource Strain:   . Difficulty of Paying Living Expenses: Not on file  Food Insecurity:   . Worried About Programme researcher, broadcasting/film/video in the Last Year: Not on file  . Ran Out of Food in the Last Year: Not on file  Transportation Needs:   .  Lack of Transportation (Medical): Not on file  . Lack of Transportation (Non-Medical): Not on file  Physical Activity:   . Days of Exercise per Week: Not on file  . Minutes of Exercise per Session: Not on file  Stress:   . Feeling of Stress : Not on file  Social Connections:   . Frequency of Communication with Friends and Family: Not on file  . Frequency of Social Gatherings with Friends and Family: Not on file  . Attends Religious Services: Not on file  . Active Member of Clubs or Organizations: Not on file  . Attends Archivist Meetings: Not on file  . Marital Status: Not on file  Intimate Partner Violence:   . Fear of Current or Ex-Partner: Not on file  . Emotionally Abused: Not on file  . Physically Abused: Not on file  . Sexually Abused: Not on file     Allergies   Cefaclor  Family history:   Family History  Problem Relation Age of Onset  . Hypertension Mother     Current Medications:   Prior to Admission medications   Medication Sig Start Date End Date Taking? Authorizing Provider  Aspirin-Acetaminophen-Caffeine (GOODY HEADACHE PO) Take 1 packet by mouth every 4 (four) hours as needed.    [provider]  diphenhydrAMINE (BENADRYL) 25 mg capsule Take 25 mg by mouth every 6 (six) hours as needed for allergies.    [provider]  FLUoxetine (PROZAC) 40 MG capsule Take 40 mg by mouth daily.    [provider]  hydrOXYzine (VISTARIL) 50 MG capsule Take 25 mg by mouth 3 (three) times daily as needed.     [provider]  naloxone [facility]) nasal spray 4 mg/0.1 mL To use in case of opioid overdose and difficulty breathing 09/01/18   Nance Pear, MD    Physical Exam:   Vitals:   02/13/19 0230 02/13/19 0630 02/13/19 0754 02/13/19 1100  BP:  126/73 112/70 127/69  Pulse: 77 78 89 89  Resp:   20 14  Temp:   (!) 97.5 F (36.4 C)   TempSrc:   Oral   SpO2: 100% 100% (!) 0% 100%  Weight:      Height:         Physical Exam: Blood pressure 127/69, pulse 89, temperature (!) 97.5 F (36.4 C), temperature source Oral, resp. rate 14, height 5\' 4"  (1.626 m), weight 68 kg, SpO2 100 %. Gen: No acute distress. Head: Normocephalic, atraumatic. Eyes: Pupils equal, round and reactive to light. Extraocular movements intact.  Sclerae icteric.  Mouth: Dry mucous membranes Neck: Supple, no thyromegaly, no lymphadenopathy, no jugular venous distention. Chest: Lungs are clear to auscultation with good air movement. No rales, rhonchi or wheezes. CV: Heart sounds are regular with an S1, S2. No murmurs, rubs or gallops.  Abdomen: Soft, nontender, nondistended with normal active bowel sounds. No palpable masses. Extremities: Extremities are without clubbing, or cyanosis.  Bilateral leg and pedal edema, pedal  pulses 2+.  Skin: Warm and dry.  Bruising on the left periorbital area, chest, upper back, bilateral arms.  Jaundice. neuro: Alert and oriented times 3; grossly nonfocal.  Psych: Insight is good and judgment is appropriate. Mood and affect normal.   Data Review:    Labs: Basic Metabolic Panel: Recent Labs  Lab 02/13/19 0115  NA 129*  K 4.1  CL 87*  CO2 17*  GLUCOSE 35*  BUN 6  CREATININE 0.49  CALCIUM 8.2*  MG 2.4  PHOS  4.6   Liver Function Tests: Recent Labs  Lab 02/13/19 0115  AST 1,138*  ALT 313*  ALKPHOS 1,026*  BILITOT 9.7*  PROT 6.1*  ALBUMIN 2.5*   No results for input(s): LIPASE, AMYLASE in the last 168 hours. Recent Labs  Lab 02/13/19 0115  AMMONIA 78*   CBC: Recent Labs  Lab 02/13/19 0115  WBC 10.1  HGB 10.0*  HCT 34.3*  MCV 97.2  PLT 95*   Cardiac Enzymes: Recent Labs  Lab 02/13/19 0115  CKTOTAL 25,608*    BNP (last 3 results) No results for input(s): PROBNP in the last 8760 hours. CBG: Recent Labs  Lab 02/13/19 0249 02/13/19 0318 02/13/19 0815  GLUCAP 36* 146* 102*    Urinalysis    Component Value Date/Time   COLORURINE AMBER (A) 02/13/2019 0333   APPEARANCEUR HAZY (A) 02/13/2019 0333   LABSPEC 1.011 02/13/2019 0333   PHURINE 6.0 02/13/2019 0333   GLUCOSEU NEGATIVE 02/13/2019 0333   HGBUR LARGE (A) 02/13/2019 0333   BILIRUBINUR NEGATIVE 02/13/2019 0333   KETONESUR 20 (A) 02/13/2019 0333   PROTEINUR 100 (A) 02/13/2019 0333   NITRITE NEGATIVE 02/13/2019 0333   LEUKOCYTESUR NEGATIVE 02/13/2019 0333      Radiographic Studies: CT Head Wo Contrast  Result Date: 02/13/2019 CLINICAL DATA:  Weakness to the left side EXAM: CT HEAD WITHOUT CONTRAST TECHNIQUE: Contiguous axial images were obtained from the base of the skull through the vertex without intravenous contrast. COMPARISON:  March 06, 2018 FINDINGS: Brain: No evidence of acute territorial infarction, hemorrhage, hydrocephalus,extra-axial collection or mass  lesion/mass effect. Normal gray-white differentiation. Ventricles are normal in size and contour. Vascular: No hyperdense vessel or unexpected calcification. Skull: The skull is intact. No fracture or focal lesion identified. Sinuses/Orbits: The visualized paranasal sinuses and mastoid air cells are clear. The orbits and globes intact. Other: None IMPRESSION: No acute intracranial abnormality. Electronically Signed   By: Jonna ClarkBindu  Avutu M.D.   On: 02/13/2019 01:46   MR BRAIN WO CONTRAST  Result Date: 02/13/2019 CLINICAL DATA:  New left-sided weakness EXAM: MRI HEAD WITHOUT CONTRAST TECHNIQUE: Multiplanar, multiecho pulse sequences of the brain and surrounding structures were obtained without intravenous contrast. COMPARISON:  Head CT 02/13/2019 FINDINGS: BRAIN: There is no acute infarct, acute hemorrhage or extra-axial collection. The white matter signal is normal for the patient's age. The cerebral and cerebellar volume are age-appropriate. There is no hydrocephalus. The midline structures are normal. VASCULAR: The major intracranial arterial and venous sinus flow voids are normal. Susceptibility-sensitive sequences show no chronic microhemorrhage or superficial siderosis. SKULL AND UPPER CERVICAL SPINE: Calvarial bone marrow signal is normal. There is no skull base mass. The visualized upper cervical spine and soft tissues are normal. SINUSES/ORBITS: There are no fluid levels or advanced mucosal thickening. The mastoid air cells and middle ear cavities are free of fluid. The orbits are normal. IMPRESSION: Normal MRI of the brain. Electronically Signed   By: Deatra RobinsonKevin  Herman M.D.   On: 02/13/2019 04:32   CT ABDOMEN PELVIS W CONTRAST  Result Date: 02/13/2019 CLINICAL DATA:  Decreased mobility for 3 months on the right side. New weakness on the left side. Acute liver failure. EXAM: CT ABDOMEN AND PELVIS WITH CONTRAST TECHNIQUE: Multidetector CT imaging of the abdomen and pelvis was performed using the standard  protocol following bolus administration of intravenous contrast. CONTRAST:  100mL OMNIPAQUE IOHEXOL 300 MG/ML  SOLN COMPARISON:  None. FINDINGS: Lower chest: Unremarkable. Hepatobiliary: The liver shows diffusely decreased attenuation suggesting fat deposition. Nonvisualization of gallbladder suggest  prior cholecystectomy. No intrahepatic or extrahepatic biliary dilation. 6 mm calcification noted in the porta hepatis. Pancreas: No focal mass lesion. No dilatation of the main duct. No intraparenchymal cyst. No peripancreatic edema. Spleen: No splenomegaly. No focal mass lesion. Adrenals/Urinary Tract: No adrenal nodule or mass. Kidneys unremarkable. No contrast excretion from the kidneys on the 2 minutes renal delay series, raising the question of dysfunction. No evidence for hydroureter. The urinary bladder appears normal for the degree of distention. Stomach/Bowel: Surgical changes consistent with gastric bypass. No small bowel wall thickening. No small bowel dilatation. The terminal ileum is normal. The appendix is best seen on coronal images and is unremarkable. No Austin colonic mass. No colonic wall thickening. Vascular/Lymphatic: No abdominal aortic aneurysm. No abdominal lymphadenopathy No pelvic sidewall lymphadenopathy. Reproductive: IUD visualized in the uterus. There is no adnexal mass. Other: No intraperitoneal free fluid. Musculoskeletal: Body wall edema noted diffusely. No worrisome lytic or sclerotic osseous abnormality. IMPRESSION: 1. Hepatic steatosis. No intra or extrahepatic biliary duct dilatation. 2. No renal mass or hydronephrosis. Kidneys are not excreting contrast on the 2 minutes renal delay series raising the question of underlying renal dysfunction. 3. Diffuse body wall edema Electronically Signed   By: Kennith Center M.D.   On: 02/13/2019 05:52   CT L-SPINE NO CHARGE  Result Date: 02/13/2019 CLINICAL DATA:  New left-sided weakness EXAM: CT LUMBAR SPINE WITHOUT CONTRAST TECHNIQUE:  Multidetector CT imaging of the lumbar spine was performed without intravenous contrast administration. Multiplanar CT image reconstructions were also generated. COMPARISON:  None. FINDINGS: Segmentation: 5 lumbar type vertebrae. Alignment: Normal. Vertebrae: No acute fracture or focal pathologic process. Paraspinal and other soft tissues: Hepatic steatosis Disc levels: There is no spinal canal stenosis or neural impingement. IMPRESSION: 1. No acute abnormality of the lumbar spine. 2. Hepatic steatosis. Electronically Signed   By: Deatra Robinson M.D.   On: 02/13/2019 05:33   DG Chest Port 1 View  Result Date: 02/13/2019 CLINICAL DATA:  Weakness EXAM: PORTABLE CHEST 1 VIEW COMPARISON:  September 03, 2018 FINDINGS: The heart size and mediastinal contours are within normal limits. Both lungs are clear. The visualized skeletal structures are unremarkable. IMPRESSION: No active disease. Electronically Signed   By: Jonna Clark M.D.   On: 02/13/2019 03:21    EKG: Independently reviewed.  Normal sinus rhythm   Assessment/Plan:   Principal Problem:   Alcoholic hepatitis Active Problems:   Rhabdomyolysis   Hypoglycemia   Metabolic acidosis with increased anion gap and accumulation of organic acids   Hyponatremia   Hypotension   Thrombocytopenia (HCC)   Body mass index is 25.75 kg/m.   Alcoholic hepatitis with hyperammonemia: Admit to MedSurg.  Patient is not encephalopathic and INR is normal.  Start lactulose.  Monitor liver enzymes.  Case was discussed with gastroenterologist, Dr. Allegra Lai, who said patient can be managed in this hospital and does not require transfer to a tertiary center.  Rhabdomyolysis: This is likely causing general body pains and contributing to the bilateral leg pain and weakness.  Treat with IV fluids and monitor CK level.  Lactic acidosis/anion gap metabolic acidosis: Differential diagnosis include alcoholic ketoacidosis, starvation ketoacidosis, dehydration, liver disease  induced acidosis.  Repeat lactic acid was 2.0, down from 3.7.  No evidence of infection thus far.  Follow-up blood cultures.  No additional antibiotics for now.  Hyponatremia: Likely from dehydration.  Treat with IV fluids  Hypotension: BP improved with IV fluids.  Thrombocytopenia: This is likely due to alcohol abuse/liver disease.  Monitor  platelet count.  Hypoglycemia: Patient was given 50% dextrose in the emergency room.  Monitor glucose level and treat as needed.  Peripheral neuropathy: Generalized weakness and falls at home: Analgesics as needed for pain.  Consult PT and OT.  Polysubstance abuse with cocaine, opiates and alcohol: Counseled to quit.  Her last alcoholic drink was this morning.  Place on CIWA protocol to prevent withdrawal.  Treat with thiamine, multivitamin and folic acid  Coronavirus screening test has been ordered   Other information:   DVT prophylaxis: SCD Code Status: Full code Family Communication: Plan discussed with patient   Disposition Plan: To be determined Consults called: Gastroenterologist Admission status: Inpatient  The medical decision making on this patient was of high complexity and the patient is at high risk for clinical deterioration, therefore this is a level 3 visit.    Time spent 65 minutes  Arbadella Kimbler [facility]   How to contact the [facility] Attending or Consulting provider 7A - 7P or covering provider during after hours 7P -7A, for this patient?   1. Check the care team in [facility] and look for a) attending/consulting [facility] provider listed and b) the [facility] team listed 2. Log into [email or web address] and use [place]'s universal password to access. If you do not have the password, please contact the hospital operator. 3. Locate the [facility] provider you are looking for under [facility] and page to a number that you can be directly reached. 4. If you still have difficulty reaching the provider, please page the [facility] (Director on Call)  for the Hospitalists listed on amion for assistance.  02/13/2019, 2:45 PM

## 2019-02-13 NOTE — ED Notes (Signed)
Social work at bedside.  

## 2019-02-14 DIAGNOSIS — T796XXD Traumatic ischemia of muscle, subsequent encounter: Secondary | ICD-10-CM

## 2019-02-14 DIAGNOSIS — M6282 Rhabdomyolysis: Secondary | ICD-10-CM

## 2019-02-14 DIAGNOSIS — K7200 Acute and subacute hepatic failure without coma: Secondary | ICD-10-CM

## 2019-02-14 LAB — URINE CULTURE: Culture: NO GROWTH

## 2019-02-14 LAB — COMPREHENSIVE METABOLIC PANEL
ALT: 218 U/L — ABNORMAL HIGH (ref 0–44)
AST: 390 U/L — ABNORMAL HIGH (ref 15–41)
Albumin: 1.6 g/dL — ABNORMAL LOW (ref 3.5–5.0)
Alkaline Phosphatase: 858 U/L — ABNORMAL HIGH (ref 38–126)
Anion gap: 10 (ref 5–15)
BUN: 13 mg/dL (ref 6–20)
CO2: 23 mmol/L (ref 22–32)
Calcium: 7.9 mg/dL — ABNORMAL LOW (ref 8.9–10.3)
Chloride: 95 mmol/L — ABNORMAL LOW (ref 98–111)
Creatinine, Ser: 0.98 mg/dL (ref 0.44–1.00)
GFR calc Af Amer: >60 mL/min (ref >60)
GFR calc non Af Amer: >60 mL/min (ref >60)
Glucose, Bld: 151 mg/dL — ABNORMAL HIGH (ref 70–99)
Potassium: 3.4 mmol/L — ABNORMAL LOW (ref 3.5–5.1)
Sodium: 128 mmol/L — ABNORMAL LOW (ref 135–145)
Total Bilirubin: 5.3 mg/dL — ABNORMAL HIGH (ref 0.3–1.2)
Total Protein: 4.3 g/dL — ABNORMAL LOW (ref 6.5–8.1)

## 2019-02-14 LAB — PROTIME-INR
INR: 1.2 (ref 0.8–1.2)
Prothrombin Time: 15.5 seconds — ABNORMAL HIGH (ref 11.4–15.2)

## 2019-02-14 LAB — CBC
HCT: 23.6 % — ABNORMAL LOW (ref 36.0–46.0)
Hemoglobin: 7.5 g/dL — ABNORMAL LOW (ref 12.0–15.0)
MCH: 29.2 pg (ref 26.0–34.0)
MCHC: 31.8 g/dL (ref 30.0–36.0)
MCV: 91.8 fL (ref 80.0–100.0)
Platelets: 70 10*3/uL — ABNORMAL LOW (ref 150–400)
RBC: 2.57 MIL/uL — ABNORMAL LOW (ref 3.87–5.11)
RDW: 24.3 % — ABNORMAL HIGH (ref 11.5–15.5)
WBC: 6.4 10*3/uL (ref 4.0–10.5)
nRBC: 0.3 % — ABNORMAL HIGH (ref 0.0–0.2)

## 2019-02-14 LAB — HEPATITIS PANEL, ACUTE
HCV Ab: NONREACTIVE
Hep A IgM: NONREACTIVE
Hep B C IgM: NONREACTIVE
Hepatitis B Surface Ag: NONREACTIVE

## 2019-02-14 LAB — CK: Total CK: 3306 U/L — ABNORMAL HIGH (ref 38–234)

## 2019-02-14 LAB — LACTIC ACID, PLASMA: Lactic Acid, Venous: 2 mmol/L (ref 0.5–1.9)

## 2019-02-14 LAB — PHOSPHORUS: Phosphorus: 1.9 mg/dL — ABNORMAL LOW (ref 2.5–4.6)

## 2019-02-14 LAB — OCCULT BLOOD X 1 CARD TO LAB, STOOL: Fecal Occult Bld: NEGATIVE

## 2019-02-14 LAB — MAGNESIUM: Magnesium: 1.9 mg/dL (ref 1.7–2.4)

## 2019-02-14 MED ORDER — POTASSIUM CHLORIDE CRYS ER 20 MEQ PO TBCR
40.0000 meq | EXTENDED_RELEASE_TABLET | Freq: Once | ORAL | Status: AC
  Administered 2019-02-14: 40 meq via ORAL
  Filled 2019-02-14: qty 2

## 2019-02-14 MED ORDER — POTASSIUM & SODIUM PHOSPHATES 280-160-250 MG PO PACK
1.0000 | PACK | Freq: Three times a day (TID) | ORAL | Status: DC
  Administered 2019-02-14 – 2019-02-16 (×6): 1 via ORAL
  Filled 2019-02-14 (×9): qty 1

## 2019-02-14 NOTE — Progress Notes (Signed)
PROGRESS NOTE    Ellen Fabianara Sliva  ZOX:[id number] DOB: 01/08/1976 DOA: 02/13/2019 PCP: Patient, No Pcp Per    Brief Narrative:  Ellen Smith is an 44 y.o. female with medical history significant for polysubstance abuse (opioids, cocaine, alcohol) chronic anemia, peripheral neuropathy, who presented to the hospital with generalized weakness and body pains.  She said she has had pain in her legs for about 3 months now.  Pain has gotten worse.  She noticed that the pain was more severe in the left leg.  She said she had to use a cane to walk because she also had some weakness in her left leg.  In the past 3 days, she noticed that the right leg had also gotten weaker and it got to a point where she could hardly walk.  She said she has had multiple falls at home that resulted in bruising of her skin.  She said she drinks about 12 cans of beer every day.Blood pressure initially was 151/36 but repeat BP was 91/59.  She was found to have significantly elevated liver enzymes, lactic acidosis, hyperammonemia but a normal INR.  Acetaminophen level was normal and CK was significantly elevated at 25,000.  CT head, MRI brain and CT lumbar spine did not show any acute abnormality.  She was given IV vancomycin, aztreonam, Flagyl and 3 L of normal saline in the emergency room    Consultants:   GI  Procedures:  CT of abdomen IMPRESSION: 1. Hepatic steatosis. No intra or extrahepatic biliary duct dilatation. 2. No renal mass or hydronephrosis. Kidneys are not excreting contrast on the 2 minutes renal delay series raising the question of underlying renal dysfunction. 3. Diffuse body wall edema  CT of the head negative for acute abnormality  MRI of brain without contrast normal  CT L-spine IMPRESSION: 1. No acute abnormality of the lumbar spine. 2. Hepatic steatosis.  Antimicrobials:   vanco and aztreonam x1 in ER   Subjective: Pt has no complaints. Denies sob, cp, n/v  Objective: Vitals:   02/13/19  2139 02/14/19 0457 02/14/19 0751 02/14/19 1441  BP: 119/70 119/63 (!) 118/54 117/63  Pulse: 99 96 89 84  Resp: 20 15 18 18   Temp: 98 F (36.7 C) 98.6 F (37 C) 98.7 F (37.1 C) 98.9 F (37.2 C)  TempSrc: Oral Oral Oral Oral  SpO2: 100% 100% 100% 100%  Weight: 74.7 kg     Height: 5\' 4"  (1.626 m)       Intake/Output Summary (Last 24 hours) at 02/14/2019 1528 Last data filed at 02/14/2019 0300 [place] per 24 hour  Intake 1535.68 ml  Output 400 ml  Net 1135.68 ml   Filed Weights   02/12/19 2338 02/13/19 2139  Weight: 68 kg 74.7 kg    Examination:  General exam: Appears calm and comfortable , mildly icterus, facial bruising Respiratory system: Clear to auscultation. Respiratory effort normal. Cardiovascular system: S1 & S2 heard, RRR. No JVD, murmurs, rubs, gallops or clicks. No pedal edema. Gastrointestinal system: Abdomen is nondistended, soft and nontender. Normal bowel sounds heard. Central nervous system: Alert and oriented. No focal neurological deficits. Extremities: ecchymosis, no edema Psychiatry: . Mood & affect appropriate.     Data Reviewed: I have personally reviewed following labs and imaging studies  CBC: Recent Labs  Lab 02/13/19 0115 02/14/19 0653  WBC 10.1 6.4  HGB 10.0* 7.5*  HCT 34.3* 23.6*  MCV 97.2 91.8  PLT 95* 70*   Basic Metabolic Panel: Recent Labs  Lab 02/13/19 0115  02/14/19 0653  NA 129* 128*  K 4.1 3.4*  CL 87* 95*  CO2 17* 23  GLUCOSE 35* 151*  BUN 6 13  CREATININE 0.49 0.98  CALCIUM 8.2* 7.9*  MG 2.4 1.9  PHOS 4.6 1.9*   GFR: Estimated Creatinine Clearance: 74.8 mL/min (by C-G formula based on SCr of 0.98 mg/dL). Liver Function Tests: Recent Labs  Lab 02/13/19 0115 02/14/19 0653  AST 1,138* 390*  ALT 313* 218*  ALKPHOS 1,026* 858*  BILITOT 9.7* 5.3*  PROT 6.1* 4.3*  ALBUMIN 2.5* 1.6*   No results for input(s): LIPASE, AMYLASE in the last 168 hours. Recent Labs  Lab 02/13/19 0115  AMMONIA 78*   Coagulation  Profile: Recent Labs  Lab 02/13/19 0115 02/14/19 0653  INR 1.1 1.2   Cardiac Enzymes: Recent Labs  Lab 02/13/19 0115 02/14/19 0653  CKTOTAL 25,608* 3,306*   BNP (last 3 results) No results for input(s): PROBNP in the last 8760 hours. HbA1C: No results for input(s): HGBA1C in the last 72 hours. CBG: Recent Labs  Lab 02/13/19 0249 02/13/19 0318 02/13/19 0815 02/13/19 2053  GLUCAP 36* 146* 102* 148*   Lipid Profile: No results for input(s): CHOL, HDL, LDLCALC, TRIG, CHOLHDL, LDLDIRECT in the last 72 hours. Thyroid Function Tests: No results for input(s): TSH, T4TOTAL, FREET4, T3FREE, THYROIDAB in the last 72 hours. Anemia Panel: No results for input(s): VITAMINB12, FOLATE, FERRITIN, TIBC, IRON, RETICCTPCT in the last 72 hours. Sepsis Labs: Recent Labs  Lab 02/13/19 0115 02/14/19 0653  LATICACIDVEN 2.0*  3.7* 2.0*    Recent Results (from the past 240 hour(s))  Blood Culture (routine x 2)     Status: None (Preliminary result)   Collection Time: 02/13/19  3:33 AM   Specimen: BLOOD  Result Value Ref Range Status   Specimen Description BLOOD LEFT ANTECUBITAL  Final   Special Requests   Final    BOTTLES DRAWN AEROBIC AND ANAEROBIC Blood Culture adequate volume   Culture   Final    NO GROWTH 1 DAY Performed at [facility], [address], [place], [place] [address]    Report Status PENDING  Incomplete  Blood Culture (routine x 2)     Status: None (Preliminary result)   Collection Time: 02/13/19  3:33 AM   Specimen: BLOOD  Result Value Ref Range Status   Specimen Description BLOOD RIGHT ANTECUBITAL  Final   Special Requests   Final    BOTTLES DRAWN AEROBIC AND ANAEROBIC Blood Culture adequate volume   Culture   Final    NO GROWTH 1 DAY Performed at [facility], [address], [place], [place] [address]    Report Status PENDING  Incomplete  Urine culture     Status: None   Collection Time: 02/13/19  3:33 AM   Specimen: In/Out Cath Urine    Result Value Ref Range Status   Specimen Description   Final    IN/OUT CATH URINE Performed at [facility], [address], [place], [place] [address]    Special Requests   Final    NONE Performed at [facility], [address] [place], [place] [address]    Culture   Final    NO GROWTH Performed at [facility], [address] [place], [place] [address]    Report Status 02/14/2019 FINAL  Final  SARS CORONAVIRUS 2 (TAT 6-24 HRS) Nasopharyngeal Nasopharyngeal Swab     Status: None   Collection Time: 02/13/19 11:42 AM   Specimen:  Nasopharyngeal Swab  Result Value Ref Range Status   SARS Coronavirus 2 NEGATIVE NEGATIVE Final    Comment: (NOTE) SARS-CoV-2 target nucleic acids are NOT DETECTED. The SARS-CoV-2 RNA is generally detectable in upper and lower respiratory specimens during the acute phase of infection. Negative results do not preclude SARS-CoV-2 infection, do not rule out co-infections with other pathogens, and should not be used as the sole basis for treatment or other patient management decisions. Negative results must be combined with clinical observations, patient history, and epidemiological information. The expected result is Negative. Fact Sheet for Patients: HairSlick.no Fact Sheet for Healthcare Providers: quierodirigir.com This test is not yet approved or cleared by the [place] FDA and  has been authorized for detection and/or diagnosis of SARS-CoV-2 by FDA under an Emergency Use Authorization (EUA). This EUA will remain  in effect (meaning this test can be used) for the duration of the COVID-19 declaration under Section 56 4(b)(1) of the Act, 21 U.S.C. section 360bbb-3(b)(1), unless the authorization is terminated or revoked sooner. Performed at [facility], [address] [place], [place] [address]          Radiology Studies: CT Head Wo  Contrast  Result Date: 02/13/2019 CLINICAL DATA:  Weakness to the left side EXAM: CT HEAD WITHOUT CONTRAST TECHNIQUE: Contiguous axial images were obtained from the base of the skull through the vertex without intravenous contrast. COMPARISON:  March 06, 2018 FINDINGS: Brain: No evidence of acute territorial infarction, hemorrhage, hydrocephalus,extra-axial collection or mass lesion/mass effect. Normal gray-white differentiation. Ventricles are normal in size and contour. Vascular: No hyperdense vessel or unexpected calcification. Skull: The skull is intact. No fracture or focal lesion identified. Sinuses/Orbits: The visualized paranasal sinuses and mastoid air cells are clear. The orbits and globes intact. Other: None IMPRESSION: No acute intracranial abnormality. Electronically Signed   By: Jonna Clark M.D.   On: 02/13/2019 01:46   MR BRAIN WO CONTRAST  Result Date: 02/13/2019 CLINICAL DATA:  New left-sided weakness EXAM: MRI HEAD WITHOUT CONTRAST TECHNIQUE: Multiplanar, multiecho pulse sequences of the brain and surrounding structures were obtained without intravenous contrast. COMPARISON:  Head CT 02/13/2019 FINDINGS: BRAIN: There is no acute infarct, acute hemorrhage or extra-axial collection. The white matter signal is normal for the patient's age. The cerebral and cerebellar volume are age-appropriate. There is no hydrocephalus. The midline structures are normal. VASCULAR: The major intracranial arterial and venous sinus flow voids are normal. Susceptibility-sensitive sequences show no chronic microhemorrhage or superficial siderosis. SKULL AND UPPER CERVICAL SPINE: Calvarial bone marrow signal is normal. There is no skull base mass. The visualized upper cervical spine and soft tissues are normal. SINUSES/ORBITS: There are no fluid levels or advanced mucosal thickening. The mastoid air cells and middle ear cavities are free of fluid. The orbits are normal. IMPRESSION: Normal MRI of the brain.  Electronically Signed   By: Deatra Robinson M.D.   On: 02/13/2019 04:32   CT ABDOMEN PELVIS W CONTRAST  Result Date: 02/13/2019 CLINICAL DATA:  Decreased mobility for 3 months on the right side. New weakness on the left side. Acute liver failure. EXAM: CT ABDOMEN AND PELVIS WITH CONTRAST TECHNIQUE: Multidetector CT imaging of the abdomen and pelvis was performed using the standard protocol following bolus administration of intravenous contrast. CONTRAST:  OMNIPAQUE IOHEXOL 300 MG/ML  SOLN COMPARISON:  None. FINDINGS: Lower chest: Unremarkable. Hepatobiliary: The liver shows diffusely decreased attenuation suggesting fat deposition. Nonvisualization of gallbladder suggest prior cholecystectomy. No intrahepatic or extrahepatic biliary dilation. 6  mm calcification noted in the porta hepatis. Pancreas: No focal mass lesion. No dilatation of the main duct. No intraparenchymal cyst. No peripancreatic edema. Spleen: No splenomegaly. No focal mass lesion. Adrenals/Urinary Tract: No adrenal nodule or mass. Kidneys unremarkable. No contrast excretion from the kidneys on the 2 minutes renal delay series, raising the question of dysfunction. No evidence for hydroureter. The urinary bladder appears normal for the degree of distention. Stomach/Bowel: Surgical changes consistent with gastric bypass. No small bowel wall thickening. No small bowel dilatation. The terminal ileum is normal. The appendix is best seen on coronal images and is unremarkable. No Olsson colonic mass. No colonic wall thickening. Vascular/Lymphatic: No abdominal aortic aneurysm. No abdominal lymphadenopathy No pelvic sidewall lymphadenopathy. Reproductive: IUD visualized in the uterus. There is no adnexal mass. Other: No intraperitoneal free fluid. Musculoskeletal: Body wall edema noted diffusely. No worrisome lytic or sclerotic osseous abnormality. IMPRESSION: 1. Hepatic steatosis. No intra or extrahepatic biliary duct dilatation. 2. No renal mass or  hydronephrosis. Kidneys are not excreting contrast on the 2 minutes renal delay series raising the question of underlying renal dysfunction. 3. Diffuse body wall edema Electronically Signed   By: Kennith Center M.D.   On: 02/13/2019 05:52   CT L-SPINE NO CHARGE  Result Date: 02/13/2019 CLINICAL DATA:  New left-sided weakness EXAM: CT LUMBAR SPINE WITHOUT CONTRAST TECHNIQUE: Multidetector CT imaging of the lumbar spine was performed without intravenous contrast administration. Multiplanar CT image reconstructions were also generated. COMPARISON:  None. FINDINGS: Segmentation: 5 lumbar type vertebrae. Alignment: Normal. Vertebrae: No acute fracture or focal pathologic process. Paraspinal and other soft tissues: Hepatic steatosis Disc levels: There is no spinal canal stenosis or neural impingement. IMPRESSION: 1. No acute abnormality of the lumbar spine. 2. Hepatic steatosis. Electronically Signed   By: Deatra Robinson M.D.   On: 02/13/2019 05:33   DG Chest Port 1 View  Result Date: 02/13/2019 CLINICAL DATA:  Weakness EXAM: PORTABLE CHEST 1 VIEW COMPARISON:  September 03, 2018 FINDINGS: The heart size and mediastinal contours are within normal limits. Both lungs are clear. The visualized skeletal structures are unremarkable. IMPRESSION: No active disease. Electronically Signed   By: Jonna Clark M.D.   On: 02/13/2019 03:21        Scheduled Meds: . Chlorhexidine Gluconate Cloth  6 each Topical Daily  . folic acid  1 mg Oral Daily  . influenza vac split quadrivalent PF  0.5 mL Intramuscular Tomorrow-1000  . lactulose  10 g Oral BID  . multivitamin with minerals  1 tablet Oral Daily  . pneumococcal 23 valent vaccine  0.5 mL Intramuscular Tomorrow-1000  . thiamine  100 mg Oral Daily   Or  . thiamine  100 mg Intravenous Daily   Continuous Infusions: . dextrose 5 % and 0.9% NaCl 150 mL/hr at 02/14/19 1413    Assessment & Plan:   Principal Problem:   Alcoholic hepatitis Active Problems:    Rhabdomyolysis   Hypoglycemia   Metabolic acidosis with increased anion gap and accumulation of organic acids   Hyponatremia   Hypotension   Thrombocytopenia (HCC)   #1 alcoholic hepatitis with hyperammonemia:  Patient is not encephalopathic and INR is normal.   Started lactulose.   LFT decreasing  Continue ivf DC Tylenol, avoid hepatotoxic agents Per GI Discriminant function score 4.2, does not meet the criteria for initiation of prednisolone   #2 rhabdomyolysis: This is likely causing general body pains and contributing to the bilateral leg pain and weakness.   Increase IV fluids  to 150 mils per hour CK trending down Continue to monitor  #3 lactic acidosis/anion gap metabolic acidosis: Differential diagnosis include alcoholic ketoacidosis, starvation ketoacidosis, dehydration, liver disease induced acidosis.   Repeat lactic acid was 2.0, down from 3.7.   No evidence of infection thus far.   Follow-up blood cultures-pending We will repeat level  #4 hyponatremia: Likely from dehydration/also from excess alcohol (beer). Monitor NA closely, continue ivf.    #5 hypotension: BP improved with IV fluids.  #6 thrombocytopenia: This is likely due to alcohol abuse/liver disease.  Monitor platelet count.  #7 hypoglycemia: Likely from low p.o. intake and increased alcohol intake  Continue D5NS IVF   #8 peripheral neuropathy: Generalized weakness and falls at home: Analgesics as needed for pain.  Consult PT and OT.  #9 polysubstance abuse with cocaine, opiates and alcohol: Counseled to quit.   Place on CIWA protocol to prevent withdrawal.   Treat with thiamine, multivitamin and folic acid  Coronavirus screening test negative.   DVT prophylaxis: SCD Code Status: Full Family Communication: None at bedside Disposition Plan: Likely in couple of days as liver enzymes improved, needs PT OT, needs hydration and correction of her sodium levels       LOS: 1 day   Time  spent: 45 minutes with more than 50% COC    Nolberto Hanlon, MD [facility] Pager [phone]  If 7PM-7AM, please contact night-coverage [email or web address] Password [facility] 02/14/2019, 3:28 PM

## 2019-02-14 NOTE — Progress Notes (Addendum)
Ellen Repress, MD [address]  [address]  [place], [place] [address]  Main: [phone]  Fax: [phone] Pager: [phone]   Subjective: Reports feeling somewhat better today, no acute events overnight.  Reports having a BM today.  Tolerating p.o. well in small portions   Objective: Vital signs in last 24 hours: Vitals:   02/13/19 2139 02/14/19 0457 02/14/19 0751 02/14/19 1441  BP: 119/70 119/63 (!) 118/54 117/63  Pulse: 99 96 89 84  Resp: 20 15 18 18   Temp: 98 F (36.7 C) 98.6 F (37 C) 98.7 F (37.1 C) 98.9 F (37.2 C)  TempSrc: Oral Oral Oral Oral  SpO2: 100% 100% 100% 100%  Weight: 74.7 kg     Height: 5\' 4"  (1.626 m)      Weight change: 6.668 kg  Intake/Output Summary (Last 24 hours) at 02/14/2019 1629 Last data filed at 02/14/2019 0300 Ruan per 24 hour  Intake 1535.68 ml  Output 400 ml  Net 1135.68 ml     Exam: Heart:: Regular rate and rhythm, S1S2 present or without murmur or extra heart sounds Lungs: normal and clear to auscultation Abdomen: soft, nontender, normal bowel sounds   Lab Results: CBC Latest Ref Rng & Units 02/14/2019 02/13/2019 09/04/2018  WBC 4.0 - 10.5 K/uL 6.4 10.1 5.5  Hemoglobin 12.0 - 15.0 g/dL 7.5(L) 10.0(L) 8.7(L)  Hematocrit 36.0 - 46.0 % 23.6(L) 34.3(L) 29.6(L)  Platelets 150 - 400 K/uL 70(L) 95(L) 155   CMP Latest Ref Rng & Units 02/14/2019 02/13/2019 09/04/2018  Glucose 70 - 99 mg/dL 04/13/2019) 09/06/2018) 88  BUN 6 - 20 mg/dL 13 6 563(O)  Creatinine 0.44 - 1.00 mg/dL 75(IE <3(P 2.95)  Sodium 135 - 145 mmol/L 128(L) 129(L) 135  Potassium 3.5 - 5.1 mmol/L 3.4(L) 4.1 4.0  Chloride 98 - 111 mmol/L 95(L) 87(L) 100  CO2 22 - 32 mmol/L 23 17(L) 27  Calcium 8.9 - 10.3 mg/dL 7.9(L) 8.2(L) 8.7(L)  Total Protein 6.5 - 8.1 g/dL 4.3(L) 6.1(L) -  Total Bilirubin 0.3 - 1.2 mg/dL 5.3(H) 9.7(H) -  Alkaline Phos 38 - 126 U/L 858(H) 1,026(H) -  AST 15 - 41 U/L 390(H) 1,138(H) -  ALT 0 - 44 U/L 218(H) 313(H) -    Micro  Results: Recent Results (from the past 240 hour(s))  Blood Culture (routine x 2)     Status: None (Preliminary result)   Collection Time: 02/13/19  3:33 AM   Specimen: BLOOD  Result Value Ref Range Status   Specimen Description BLOOD LEFT ANTECUBITAL  Final   Special Requests   Final    BOTTLES DRAWN AEROBIC AND ANAEROBIC Blood Culture adequate volume   Culture   Final    NO GROWTH 1 DAY Performed at [facility], [address], [place], [address] [place]    Report Status PENDING  Incomplete  Blood Culture (routine x 2)     Status: None (Preliminary result)   Collection Time: 02/13/19  3:33 AM   Specimen: BLOOD  Result Value Ref Range Status   Specimen Description BLOOD RIGHT ANTECUBITAL  Final   Special Requests   Final    BOTTLES DRAWN AEROBIC AND ANAEROBIC Blood Culture adequate volume   Culture   Final    NO GROWTH 1 DAY Performed at [facility], [address], [place], [address] [place]    Report Status PENDING  Incomplete  Urine culture     Status: None   Collection Time: 02/13/19  3:33 AM  Specimen: In/Out Cath Urine  Result Value Ref Range Status   Specimen Description   Final    IN/OUT CATH URINE Performed at [facility], [address], [place], [place] [address]    Special Requests   Final    NONE Performed at [facility], [address] [place], [place] [address]    Culture   Final    NO GROWTH Performed at [facility], [address], [place], [place] [address]    Report Status 02/14/2019 FINAL  Final  SARS CORONAVIRUS 2 (TAT 6-24 HRS) Nasopharyngeal Nasopharyngeal Swab     Status: None   Collection Time: 02/13/19 11:42 AM   Specimen: Nasopharyngeal Swab  Result Value Ref Range Status   SARS Coronavirus 2 NEGATIVE NEGATIVE Final    Comment: (NOTE) SARS-CoV-2 target nucleic acids are NOT DETECTED. The SARS-CoV-2 RNA is generally detectable in upper and lower respiratory specimens during the acute  phase of infection. Negative results do not preclude SARS-CoV-2 infection, do not rule out co-infections with other pathogens, and should not be used as the sole basis for treatment or other patient management decisions. Negative results must be combined with clinical observations, patient history, and epidemiological information. The expected result is Negative. Fact Sheet for Patients: HairSlick.no Fact Sheet for Healthcare Providers: quierodirigir.com This test is not yet approved or cleared by the [place] FDA and  has been authorized for detection and/or diagnosis of SARS-CoV-2 by FDA under an Emergency Use Authorization (EUA). This EUA will remain  in effect (meaning this test can be used) for the duration of the COVID-19 declaration under Section 56 4(b)(1) of the Act, 21 U.S.C. section 360bbb-3(b)(1), unless the authorization is terminated or revoked sooner. Performed at [facility], [address] [place], [place] [address]    Studies/Results: CT Head Wo Contrast  Result Date: 02/13/2019 CLINICAL DATA:  Weakness to the left side EXAM: CT HEAD WITHOUT CONTRAST TECHNIQUE: Contiguous axial images were obtained from the base of the skull through the vertex without intravenous contrast. COMPARISON:  March 06, 2018 FINDINGS: Brain: No evidence of acute territorial infarction, hemorrhage, hydrocephalus,extra-axial collection or mass lesion/mass effect. Normal gray-white differentiation. Ventricles are normal in size and contour. Vascular: No hyperdense vessel or unexpected calcification. Skull: The skull is intact. No fracture or focal lesion identified. Sinuses/Orbits: The visualized paranasal sinuses and mastoid air cells are clear. The orbits and globes intact. Other: None IMPRESSION: No acute intracranial abnormality. Electronically Signed   By: Jonna Clark M.D.   On: 02/13/2019 01:46   MR BRAIN WO CONTRAST  Result  Date: 02/13/2019 CLINICAL DATA:  New left-sided weakness EXAM: MRI HEAD WITHOUT CONTRAST TECHNIQUE: Multiplanar, multiecho pulse sequences of the brain and surrounding structures were obtained without intravenous contrast. COMPARISON:  Head CT 02/13/2019 FINDINGS: BRAIN: There is no acute infarct, acute hemorrhage or extra-axial collection. The white matter signal is normal for the patient's age. The cerebral and cerebellar volume are age-appropriate. There is no hydrocephalus. The midline structures are normal. VASCULAR: The major intracranial arterial and venous sinus flow voids are normal. Susceptibility-sensitive sequences show no chronic microhemorrhage or superficial siderosis. SKULL AND UPPER CERVICAL SPINE: Calvarial bone marrow signal is normal. There is no skull base mass. The visualized upper cervical spine and soft tissues are normal. SINUSES/ORBITS: There are no fluid levels or advanced mucosal thickening. The mastoid air cells and middle ear cavities are free of fluid. The orbits are normal. IMPRESSION: Normal MRI of the brain. Electronically  Signed   By: Ulyses Jarred M.D.   On: 02/13/2019 04:32   CT ABDOMEN PELVIS W CONTRAST  Result Date: 02/13/2019 CLINICAL DATA:  Decreased mobility for 3 months on the right side. New weakness on the left side. Acute liver failure. EXAM: CT ABDOMEN AND PELVIS WITH CONTRAST TECHNIQUE: Multidetector CT imaging of the abdomen and pelvis was performed using the standard protocol following bolus administration of intravenous contrast. CONTRAST:  112mL OMNIPAQUE IOHEXOL 300 MG/ML  SOLN COMPARISON:  None. FINDINGS: Lower chest: Unremarkable. Hepatobiliary: The liver shows diffusely decreased attenuation suggesting fat deposition. Nonvisualization of gallbladder suggest prior cholecystectomy. No intrahepatic or extrahepatic biliary dilation. 6 mm calcification noted in the porta hepatis. Pancreas: No focal mass lesion. No dilatation of the main duct. No intraparenchymal  cyst. No peripancreatic edema. Spleen: No splenomegaly. No focal mass lesion. Adrenals/Urinary Tract: No adrenal nodule or mass. Kidneys unremarkable. No contrast excretion from the kidneys on the 2 minutes renal delay series, raising the question of dysfunction. No evidence for hydroureter. The urinary bladder appears normal for the degree of distention. Stomach/Bowel: Surgical changes consistent with gastric bypass. No small bowel wall thickening. No small bowel dilatation. The terminal ileum is normal. The appendix is best seen on coronal images and is unremarkable. No Seelman colonic mass. No colonic wall thickening. Vascular/Lymphatic: No abdominal aortic aneurysm. No abdominal lymphadenopathy No pelvic sidewall lymphadenopathy. Reproductive: IUD visualized in the uterus. There is no adnexal mass. Other: No intraperitoneal free fluid. Musculoskeletal: Body wall edema noted diffusely. No worrisome lytic or sclerotic osseous abnormality. IMPRESSION: 1. Hepatic steatosis. No intra or extrahepatic biliary duct dilatation. 2. No renal mass or hydronephrosis. Kidneys are not excreting contrast on the 2 minutes renal delay series raising the question of underlying renal dysfunction. 3. Diffuse body wall edema Electronically Signed   By: Misty Stanley M.D.   On: 02/13/2019 05:52   CT L-SPINE NO CHARGE  Result Date: 02/13/2019 CLINICAL DATA:  New left-sided weakness EXAM: CT LUMBAR SPINE WITHOUT CONTRAST TECHNIQUE: Multidetector CT imaging of the lumbar spine was performed without intravenous contrast administration. Multiplanar CT image reconstructions were also generated. COMPARISON:  None. FINDINGS: Segmentation: 5 lumbar type vertebrae. Alignment: Normal. Vertebrae: No acute fracture or focal pathologic process. Paraspinal and other soft tissues: Hepatic steatosis Disc levels: There is no spinal canal stenosis or neural impingement. IMPRESSION: 1. No acute abnormality of the lumbar spine. 2. Hepatic steatosis.  Electronically Signed   By: Ulyses Jarred M.D.   On: 02/13/2019 05:33   DG Chest Port 1 View  Result Date: 02/13/2019 CLINICAL DATA:  Weakness EXAM: PORTABLE CHEST 1 VIEW COMPARISON:  September 03, 2018 FINDINGS: The heart size and mediastinal contours are within normal limits. Both lungs are clear. The visualized skeletal structures are unremarkable. IMPRESSION: No active disease. Electronically Signed   By: Prudencio Pair M.D.   On: 02/13/2019 03:21   Medications:  I have reviewed the patient's current medications. Prior to Admission:  No medications prior to admission.   Scheduled: . Chlorhexidine Gluconate Cloth  6 each Topical Daily  . folic acid  1 mg Oral Daily  . influenza vac split quadrivalent PF  0.5 mL Intramuscular Tomorrow-1000  . lactulose  10 g Oral BID  . multivitamin with minerals  1 tablet Oral Daily  . pneumococcal 23 valent vaccine  0.5 mL Intramuscular Tomorrow-1000  . potassium & sodium phosphates  1 packet Oral TID AC & HS  . thiamine  100 mg Oral Daily   Or  .  thiamine  100 mg Intravenous Daily   Continuous: . dextrose 5 % and 0.9% NaCl 150 mL/hr at 02/14/19 1413   YYT:[id number] **OR** LORazepam, ondansetron **OR** ondansetron (ZOFRAN) IV, polyethylene glycol Anti-infectives (From admission, onward)   Start     Dose/Rate Route Frequency Ordered Stop   02/13/19 0300  aztreonam (AZACTAM) 2 g in sodium chloride 0.9 % 100 mL IVPB     2 g 200 mL/hr over 30 Minutes Intravenous  Once 02/13/19 0246 02/13/19 0542   02/13/19 0300  metroNIDAZOLE (FLAGYL) IVPB 500 mg     500 mg 100 mL/hr over 60 Minutes Intravenous  Once 02/13/19 0246 02/13/19 0758   02/13/19 0300  vancomycin (VANCOCIN) IVPB 1000 mg/200 mL premix     1,000 mg 200 mL/hr over 60 Minutes Intravenous  Once 02/13/19 0246 02/13/19 0645     Scheduled Meds: . Chlorhexidine Gluconate Cloth  6 each Topical Daily  . folic acid  1 mg Oral Daily  . influenza vac split quadrivalent PF  0.5 mL Intramuscular  Tomorrow-1000  . lactulose  10 g Oral BID  . multivitamin with minerals  1 tablet Oral Daily  . pneumococcal 23 valent vaccine  0.5 mL Intramuscular Tomorrow-1000  . potassium & sodium phosphates  1 packet Oral TID AC & HS  . thiamine  100 mg Oral Daily   Or  . thiamine  100 mg Intravenous Daily   Continuous Infusions: . dextrose 5 % and 0.9% NaCl 150 mL/hr at 02/14/19 1413   PRN Meds:.LORazepam **OR** LORazepam, ondansetron **OR** ondansetron (ZOFRAN) IV, polyethylene glycol   Assessment: Principal Problem:   Alcoholic hepatitis Active Problems:   Rhabdomyolysis   Hypoglycemia   Metabolic acidosis with increased anion gap and accumulation of organic acids   Hyponatremia   Hypotension   Thrombocytopenia (HCC)   Plan: Acute hepatitis: Combination of acute on chronic status post rhabdomyolysis plus cocaine use LFTs are downtrending Acute viral hepatitis panel negative Replete electrolytes aggressively Continue multivitamin plus thiamine plus folate daily Adequate nutrition Complete abstinence from alcohol use  Rhabdomyolysis Total CK is significantly improved Continue IV hydration, recommend IV albumin and check of isotonic fluids to prevent worsening of volume overload  Volume overload/anasarca secondary to severe malnutrition resulting in severe hypoalbuminemia Optimize nutrition Replete low phosphorus Trial of low-dose diuretics after resolution of rhabdomyolysis  Normocytic anemia: No evidence of active GI bleed Hemoglobin is worse, secondary to hemodilution as well as component of anemia of chronic disease plus malnutrition plus bone marrow suppression secondary to alcohol abuse  Follow-up with GI as outpatient    LOS: 1 day   Hewitt Garner 02/14/2019, 4:29 PM

## 2019-02-14 NOTE — Evaluation (Signed)
Physical Therapy Evaluation Patient Details Name: Ellen Smith MRN: [id number] DOB: 03-26-1975 Today's Date: 02/14/2019   History of Present Illness  43 y.o. female with medical history significant for polysubstance abuse (opioids, cocaine, alcohol) chronic anemia, peripheral neuropathy, who presented to the hospital with generalized weakness and body pains.  She said she has had pain in her legs for about 3 months now.  Pain has gotten worse.  She noticed that the pain was more severe in the left leg.  She said she had to use a cane to walk because she also had some weakness in her left leg and now is having it in her right leg  Clinical Impression  Pt is a pleasant 44 year old female who was admitted for alcoholic hepatitis. Pt performs bed mobility with mod assist and unable to further perform transfers or OOB mobility. Pt demonstrates deficits with B LE strength/mobility. Pt reports she was indep prior to Sept and has been having increased falls. Is currently not at baseline level. Would benefit from skilled PT to address above deficits and promote optimal return to PLOF; recommend transition to STR upon discharge from acute hospitalization.     Follow Up Recommendations SNF    Equipment Recommendations  Wheelchair (measurements PT)    Recommendations for Other Services       Precautions / Restrictions Precautions Precautions: Fall Restrictions Weight Bearing Restrictions: No      Mobility  Bed Mobility Overal bed mobility: Needs Assistance Bed Mobility: Supine to Sit     Supine to sit: Mod assist     General bed mobility comments: needs assist for trunk and B LE management. Once seated at EOB, able to maintain seated balance, however reports pain so only able to sit for a few minutes. Request to return back to bed. Able to use railing to slide up in bed with UE, no assist from LE  Transfers                 General transfer comment: unable  Ambulation/Gait                Stairs            Wheelchair Mobility    Modified Rankin (Stroke Patients Only)       Balance Overall balance assessment: Needs assistance;History of Falls Sitting-balance support: Feet unsupported;Bilateral upper extremity supported Sitting balance-Leahy Scale: Fair                                       Pertinent Vitals/Pain Pain Assessment: Faces Faces Pain Scale: Hurts little more Pain Location: B LE with movement Pain Descriptors / Indicators: Aching Pain Intervention(s): Limited activity within patient's tolerance;Repositioned    Home Living Family/patient expects to be discharged to:: Private residence Living Arrangements: (fiance) Available Help at Discharge: Family;Available 24 hours/day Type of Home: House Home Access: Stairs to enter Entrance Stairs-Rails: None Entrance Stairs-Number of Steps: 1 Home Layout: Two level;Able to live on main level with bedroom/bathroom Home Equipment: Walker - 2 wheels      Prior Function Level of Independence: Needs assistance   Gait / Transfers Assistance Needed: prior to Sept, completely indep, then incidious weakness occurred needing RW to mobilize     Comments: now relies on finace for most mobility. + falls due to B LE buckling. Spends lots of time in bed. Uses RW for short distances  Hand Dominance        Extremity/Trunk Assessment   Upper Extremity Assessment Upper Extremity Assessment: Generalized weakness(B UE grossly 4/5)    Lower Extremity Assessment Lower Extremity Assessment: Generalized weakness(B LE grossly 2/5)       Communication   Communication: No difficulties  Cognition Arousal/Alertness: Awake/alert Behavior During Therapy: WFL for tasks assessed/performed Overall Cognitive Status: Within Functional Limits for tasks assessed                                        General Comments      Exercises Other Exercises Other Exercises:  Supine ther-ex performed on B LE including quad sets, hip add squeezes, SAQ, and hip abd/add. B UE shoulder flexion performed. CGA assist for ther-ex. Wrote HEP on board for continuing for hospital stay   Assessment/Plan    PT Assessment Patient needs continued PT services  PT Problem List Decreased strength;Decreased activity tolerance;Decreased balance;Decreased mobility;Pain       PT Treatment Interventions Gait training;Therapeutic exercise;Balance training;DME instruction;Therapeutic activities    PT Goals (Current goals can be found in the Care Plan section)  Acute Rehab PT Goals Patient Stated Goal: to get better PT Goal Formulation: With patient Time For Goal Achievement: 02/28/19 Potential to Achieve Goals: Good    Frequency Min 2X/week   Barriers to discharge        Co-evaluation               AM-PAC PT "6 Clicks" Mobility  Outcome Measure Help needed turning from your back to your side while in a flat bed without using bedrails?: A Lot Help needed moving from lying on your back to sitting on the side of a flat bed without using bedrails?: A Lot Help needed moving to and from a bed to a chair (including a wheelchair)?: A Lot Help needed standing up from a chair using your arms (e.g., wheelchair or bedside chair)?: A Lot Help needed to walk in hospital room?: Total Help needed climbing 3-5 steps with a railing? : Total 6 Click Score: 10    End of Session   Activity Tolerance: Patient limited by fatigue Patient left: in bed;with bed alarm set;with SCD's reapplied Nurse Communication: Mobility status PT Visit Diagnosis: Muscle weakness (generalized) (M62.81);Repeated falls (R29.6);History of falling (Z91.81);Difficulty in walking, not elsewhere classified (R26.2);Pain Pain - Right/Left: (bilat) Pain - part of body: Leg    Time: 4431-5400 PT Time Calculation (min) (ACUTE ONLY): 29 min   Charges:   PT Evaluation $PT Eval Low Complexity: 1 Low PT  Treatments $Therapeutic Exercise: 8-22 mins        Elizabeth Palau, PT, DPT [phone]   Theordore Cisnero 02/14/2019, 4:44 PM

## 2019-02-15 LAB — CBC
HCT: 21.6 % — ABNORMAL LOW (ref 36.0–46.0)
Hemoglobin: 6.7 g/dL — ABNORMAL LOW (ref 12.0–15.0)
MCH: 29 pg (ref 26.0–34.0)
MCHC: 31 g/dL (ref 30.0–36.0)
MCV: 93.5 fL (ref 80.0–100.0)
Platelets: 60 K/uL — ABNORMAL LOW (ref 150–400)
RBC: 2.31 MIL/uL — ABNORMAL LOW (ref 3.87–5.11)
RDW: 24.2 % — ABNORMAL HIGH (ref 11.5–15.5)
WBC: 4.8 K/uL (ref 4.0–10.5)
nRBC: 0 % (ref 0.0–0.2)

## 2019-02-15 LAB — COMPREHENSIVE METABOLIC PANEL
ALT: 193 U/L — ABNORMAL HIGH (ref 0–44)
AST: 289 U/L — ABNORMAL HIGH (ref 15–41)
Albumin: 1.6 g/dL — ABNORMAL LOW (ref 3.5–5.0)
Alkaline Phosphatase: 978 U/L — ABNORMAL HIGH (ref 38–126)
Anion gap: 7 (ref 5–15)
BUN: 15 mg/dL (ref 6–20)
CO2: 23 mmol/L (ref 22–32)
Calcium: 7.6 mg/dL — ABNORMAL LOW (ref 8.9–10.3)
Chloride: 99 mmol/L (ref 98–111)
Creatinine, Ser: 0.88 mg/dL (ref 0.44–1.00)
GFR calc Af Amer: >60 mL/min (ref >60)
GFR calc non Af Amer: >60 mL/min (ref >60)
Glucose, Bld: 111 mg/dL — ABNORMAL HIGH (ref 70–99)
Potassium: 3.6 mmol/L (ref 3.5–5.1)
Sodium: 129 mmol/L — ABNORMAL LOW (ref 135–145)
Total Bilirubin: 3.2 mg/dL — ABNORMAL HIGH (ref 0.3–1.2)
Total Protein: 4.4 g/dL — ABNORMAL LOW (ref 6.5–8.1)

## 2019-02-15 LAB — HEMOGLOBIN AND HEMATOCRIT, BLOOD
HCT: 25.6 % — ABNORMAL LOW (ref 36.0–46.0)
Hemoglobin: 8.2 g/dL — ABNORMAL LOW (ref 12.0–15.0)

## 2019-02-15 LAB — MAGNESIUM: Magnesium: 1.9 mg/dL (ref 1.7–2.4)

## 2019-02-15 LAB — PHOSPHORUS: Phosphorus: 1.3 mg/dL — ABNORMAL LOW (ref 2.5–4.6)

## 2019-02-15 LAB — PREPARE RBC (CROSSMATCH)

## 2019-02-15 LAB — CK: Total CK: 2110 U/L — ABNORMAL HIGH (ref 38–234)

## 2019-02-15 MED ORDER — TRAMADOL HCL 50 MG PO TABS
50.0000 mg | ORAL_TABLET | Freq: Once | ORAL | Status: AC
  Administered 2019-02-15: 50 mg via ORAL
  Filled 2019-02-15: qty 1

## 2019-02-15 MED ORDER — SODIUM CHLORIDE 0.9% IV SOLUTION: Freq: Once | INTRAVENOUS | Status: AC

## 2019-02-15 MED ORDER — ACETAMINOPHEN 325 MG PO TABS
650.0000 mg | ORAL_TABLET | Freq: Once | ORAL | Status: AC
  Administered 2019-02-15: 650 mg via ORAL
  Filled 2019-02-15: qty 2

## 2019-02-15 MED ORDER — TRAZODONE HCL 100 MG PO TABS
100.0000 mg | ORAL_TABLET | Freq: Every evening | ORAL | Status: DC | PRN
  Administered 2019-02-15 – 2019-02-18 (×5): 100 mg via ORAL
  Filled 2019-02-15 (×6): qty 1

## 2019-02-15 MED ORDER — NICOTINE 21 MG/24HR TD PT24
21.0000 mg | MEDICATED_PATCH | Freq: Every day | TRANSDERMAL | Status: DC
  Administered 2019-02-15 – 2019-02-19 (×6): 21 mg via TRANSDERMAL
  Filled 2019-02-15 (×7): qty 1

## 2019-02-15 NOTE — Progress Notes (Signed)
OT Cancellation Note  Patient Details Name: Ellen Smith MRN: [id number] DOB: 17-Dec-1975   Cancelled Treatment:    Reason Eval/Treat Not Completed: Medical issues which prohibited therapy Order received and chart reviewed.  Pt currently with RBC of 2.31, Hemoglobin of 6.7 and HCT 21.6.  Per OT protocol, pt is not medically appropriate for therapy and will place on hold and re-assess again tomorrow.  Thank you for the referral.  Susanne Borders, OTR/L, [facility] ascom [phone] 02/15/19, 9:41 AM

## 2019-02-15 NOTE — Progress Notes (Signed)
PT Cancellation Note  Patient Details Name: Ellen Smith MRN: [id number] DOB: Nov 16, 1975   Cancelled Treatment:    Reason Eval/Treat Not Completed: Other (comment). Pt currently with low Hgb pending blood transfusion this date. Will hold treatment at this time. Will re-attempt when medically stable.   Signe Tackitt 02/15/2019, 11:24 AM  Elizabeth Palau, PT, DPT [phone]

## 2019-02-16 LAB — TYPE AND SCREEN
ABO/RH(D): A NEG
Antibody Screen: NEGATIVE
Unit division: 0

## 2019-02-16 LAB — COMPREHENSIVE METABOLIC PANEL
ALT: 156 U/L — ABNORMAL HIGH (ref 0–44)
AST: 197 U/L — ABNORMAL HIGH (ref 15–41)
Albumin: 1.6 g/dL — ABNORMAL LOW (ref 3.5–5.0)
Alkaline Phosphatase: 932 U/L — ABNORMAL HIGH (ref 38–126)
Anion gap: 6 (ref 5–15)
BUN: 16 mg/dL (ref 6–20)
CO2: 23 mmol/L (ref 22–32)
Calcium: 7.6 mg/dL — ABNORMAL LOW (ref 8.9–10.3)
Chloride: 100 mmol/L (ref 98–111)
Creatinine, Ser: 0.78 mg/dL (ref 0.44–1.00)
GFR calc Af Amer: >60 mL/min (ref >60)
GFR calc non Af Amer: >60 mL/min (ref >60)
Glucose, Bld: 103 mg/dL — ABNORMAL HIGH (ref 70–99)
Potassium: 3.4 mmol/L — ABNORMAL LOW (ref 3.5–5.1)
Sodium: 129 mmol/L — ABNORMAL LOW (ref 135–145)
Total Bilirubin: 2.2 mg/dL — ABNORMAL HIGH (ref 0.3–1.2)
Total Protein: 4.4 g/dL — ABNORMAL LOW (ref 6.5–8.1)

## 2019-02-16 LAB — CBC
HCT: 24.2 % — ABNORMAL LOW (ref 36.0–46.0)
Hemoglobin: 7.8 g/dL — ABNORMAL LOW (ref 12.0–15.0)
MCH: 30.7 pg (ref 26.0–34.0)
MCHC: 32.2 g/dL (ref 30.0–36.0)
MCV: 95.3 fL (ref 80.0–100.0)
Platelets: 70 10*3/uL — ABNORMAL LOW (ref 150–400)
RBC: 2.54 MIL/uL — ABNORMAL LOW (ref 3.87–5.11)
RDW: 24.5 % — ABNORMAL HIGH (ref 11.5–15.5)
WBC: 5.1 10*3/uL (ref 4.0–10.5)
nRBC: 0 % (ref 0.0–0.2)

## 2019-02-16 LAB — BPAM RBC
Blood Product Expiration Date: 202101222359
ISSUE DATE / TIME: 202101111437
Unit Type and Rh: 600

## 2019-02-16 LAB — CK: Total CK: 1351 U/L — ABNORMAL HIGH (ref 38–234)

## 2019-02-16 LAB — PHOSPHORUS: Phosphorus: 1.5 mg/dL — ABNORMAL LOW (ref 2.5–4.6)

## 2019-02-16 MED ORDER — POTASSIUM PHOSPHATES 15 MMOLE/5ML IV SOLN
15.0000 mmol | Freq: Once | INTRAVENOUS | Status: AC
  Administered 2019-02-16: 15 mmol via INTRAVENOUS
  Filled 2019-02-16: qty 5

## 2019-02-16 MED ORDER — GABAPENTIN 100 MG PO CAPS
100.0000 mg | ORAL_CAPSULE | Freq: Every day | ORAL | Status: DC
  Administered 2019-02-16: 100 mg via ORAL
  Filled 2019-02-16: qty 1

## 2019-02-16 MED ORDER — POTASSIUM CHLORIDE CRYS ER 20 MEQ PO TBCR
40.0000 meq | EXTENDED_RELEASE_TABLET | Freq: Once | ORAL | Status: AC
  Administered 2019-02-16: 40 meq via ORAL
  Filled 2019-02-16: qty 2

## 2019-02-16 MED ORDER — POTASSIUM PHOSPHATES 15 MMOLE/5ML IV SOLN
15.0000 mmol | Freq: Once | INTRAVENOUS | Status: DC
  Administered 2019-02-16: 15 mmol via INTRAVENOUS
  Filled 2019-02-16: qty 5

## 2019-02-16 MED ORDER — TRAMADOL HCL 50 MG PO TABS
50.0000 mg | ORAL_TABLET | Freq: Two times a day (BID) | ORAL | Status: DC | PRN
  Administered 2019-02-16 – 2019-02-18 (×4): 50 mg via ORAL
  Filled 2019-02-16 (×5): qty 1

## 2019-02-16 NOTE — Progress Notes (Signed)
Ellen Darby, MD [address]  [address]  [place], [place] [address]  Main: [phone]  Fax: [phone] Pager: [phone]   Subjective: Reports feeling significantly better today, no acute events overnight.  Reports having a BM today.  Tolerating p.o. well, received 1 unit of PRBCs yesterday.  Patient's fianc at bedside   Objective: Vital signs in last 24 hours: Vitals:   02/15/19 2003 02/16/19 0441 02/16/19 0741 02/16/19 1458  BP: (!) 111/59 127/65 117/61 137/77  Pulse: 89 85 86 87  Resp: 19 18 16 18   Temp: 98.3 F (36.8 C) 98.8 F (37.1 C) 98.4 F (36.9 C) 98.6 F (37 C)  TempSrc: Oral Oral Oral Oral  SpO2: 100% 100% 100% 100%  Weight:  82.5 kg    Height:       Weight change: 2.767 kg  Intake/Output Summary (Last 24 hours) at 02/16/2019 1704 Last data filed at 02/16/2019 1500 [place] per 24 hour  Intake 6939.12 ml  Output 1525 ml  Net 5414.12 ml     Exam: Heart:: Regular rate and rhythm, S1S2 present or without murmur or extra heart sounds Lungs: normal and clear to auscultation Abdomen: soft, nontender, normal bowel sounds   Lab Results: CBC Latest Ref Rng & Units 02/16/2019 02/15/2019 02/15/2019  WBC 4.0 - 10.5 K/uL 5.1 - 4.8  Hemoglobin 12.0 - 15.0 g/dL 7.8(L) 8.2(L) 6.7(L)  Hematocrit 36.0 - 46.0 % 24.2(L) 25.6(L) 21.6(L)  Platelets 150 - 400 K/uL 70(L) - 60(L)   CMP Latest Ref Rng & Units 02/16/2019 02/15/2019 02/14/2019  Glucose 70 - 99 mg/dL 103(H) 111(H) 151(H)  BUN 6 - 20 mg/dL 16 15 13   Creatinine 0.44 - 1.00 mg/dL 0.78 0.88 0.98  Sodium 135 - 145 mmol/L 129(L) 129(L) 128(L)  Potassium 3.5 - 5.1 mmol/L 3.4(L) 3.6 3.4(L)  Chloride 98 - 111 mmol/L 100 99 95(L)  CO2 22 - 32 mmol/L 23 23 23   Calcium 8.9 - 10.3 mg/dL 7.6(L) 7.6(L) 7.9(L)  Total Protein 6.5 - 8.1 g/dL 4.4(L) 4.4(L) 4.3(L)  Total Bilirubin 0.3 - 1.2 mg/dL 2.2(H) 3.2(H) 5.3(H)  Alkaline Phos 38 - 126 U/L 932(H) 978(H) 858(H)  AST 15 - 41 U/L 197(H) 289(H) 390(H)  ALT 0 -  44 U/L 156(H) 193(H) 218(H)    Micro Results: Recent Results (from the past 240 hour(s))  Blood Culture (routine x 2)     Status: None (Preliminary result)   Collection Time: 02/13/19  3:33 AM   Specimen: BLOOD  Result Value Ref Range Status   Specimen Description BLOOD LEFT ANTECUBITAL  Final   Special Requests   Final    BOTTLES DRAWN AEROBIC AND ANAEROBIC Blood Culture adequate volume   Culture   Final    NO GROWTH 3 DAYS Performed at [facility], [address] [place], [place] [address]    Report Status PENDING  Incomplete  Blood Culture (routine x 2)     Status: None (Preliminary result)   Collection Time: 02/13/19  3:33 AM   Specimen: BLOOD  Result Value Ref Range Status   Specimen Description BLOOD RIGHT ANTECUBITAL  Final   Special Requests   Final    BOTTLES DRAWN AEROBIC AND ANAEROBIC Blood Culture adequate volume   Culture   Final    NO GROWTH 3 DAYS Performed at [facility], [address], [place], [place] [address]    Report Status PENDING  Incomplete  Urine culture     Status: None   Collection Time:  02/13/19  3:33 AM   Specimen: In/Out Cath Urine  Result Value Ref Range Status   Specimen Description   Final    IN/OUT CATH URINE Performed at [facility], [address], [place], [place] [address]    Special Requests   Final    NONE Performed at [facility], [address], [place], [place] [address]    Culture   Final    NO GROWTH Performed at [facility], [address], [place], [place] [address]    Report Status 02/14/2019 FINAL  Final  SARS CORONAVIRUS 2 (TAT 6-24 HRS) Nasopharyngeal Nasopharyngeal Swab     Status: None   Collection Time: 02/13/19 11:42 AM   Specimen: Nasopharyngeal Swab  Result Value Ref Range Status   SARS Coronavirus 2 NEGATIVE NEGATIVE Final    Comment: (NOTE) SARS-CoV-2 target nucleic acids are NOT DETECTED. The SARS-CoV-2 RNA is generally detectable in upper and  lower respiratory specimens during the acute phase of infection. Negative results do not preclude SARS-CoV-2 infection, do not rule out co-infections with other pathogens, and should not be used as the sole basis for treatment or other patient management decisions. Negative results must be combined with clinical observations, patient history, and epidemiological information. The expected result is Negative. Fact Sheet for Patients: HairSlick.no Fact Sheet for Healthcare Providers: quierodirigir.com This test is not yet approved or cleared by the [place] FDA and  has been authorized for detection and/or diagnosis of SARS-CoV-2 by FDA under an Emergency Use Authorization (EUA). This EUA will remain  in effect (meaning this test can be used) for the duration of the COVID-19 declaration under Section 56 4(b)(1) of the Act, 21 U.S.C. section 360bbb-3(b)(1), unless the authorization is terminated or revoked sooner. Performed at [facility], [address], [place], [place] [address]    Studies/Results: No results found. Medications:  I have reviewed the patient's current medications. Prior to Admission:  No medications prior to admission.   Scheduled: . folic acid  1 mg Oral Daily  . influenza vac split quadrivalent PF  0.5 mL Intramuscular Tomorrow-1000  . lactulose  10 g Oral BID  . multivitamin with minerals  1 tablet Oral Daily  . nicotine  21 mg Transdermal Daily  . pneumococcal 23 valent vaccine  0.5 mL Intramuscular Tomorrow-1000  . thiamine  100 mg Oral Daily   Or  . thiamine  100 mg Intravenous Daily   Continuous: . potassium PHOSPHATE IVPB (in mmol) 15 mmol (02/16/19 1026)   LEX:[id number] **OR** ondansetron (ZOFRAN) IV, polyethylene glycol, traMADol, traZODone Anti-infectives (From admission, onward)   Start     Dose/Rate Route Frequency Ordered Stop   02/13/19 0300  aztreonam (AZACTAM) 2 g in  sodium chloride 0.9 % 100 mL IVPB     2 g 200 mL/hr over 30 Minutes Intravenous  Once 02/13/19 0246 02/13/19 0542   02/13/19 0300  metroNIDAZOLE (FLAGYL) IVPB 500 mg     500 mg 100 mL/hr over 60 Minutes Intravenous  Once 02/13/19 0246 02/13/19 0758   02/13/19 0300  vancomycin (VANCOCIN) IVPB 1000 mg/200 mL premix     1,000 mg 200 mL/hr over 60 Minutes Intravenous  Once 02/13/19 0246 02/13/19 0645     Scheduled Meds: . folic acid  1 mg Oral Daily  . influenza vac split quadrivalent PF  0.5 mL Intramuscular Tomorrow-1000  . lactulose  10 g Oral BID  . multivitamin with minerals  1 tablet Oral Daily  .  nicotine  21 mg Transdermal Daily  . pneumococcal 23 valent vaccine  0.5 mL Intramuscular Tomorrow-1000  . thiamine  100 mg Oral Daily   Or  . thiamine  100 mg Intravenous Daily   Continuous Infusions: . potassium PHOSPHATE IVPB (in mmol) 15 mmol (02/16/19 1026)   PRN Meds:.ondansetron **OR** ondansetron (ZOFRAN) IV, polyethylene glycol, traMADol, traZODone   Assessment: Principal Problem:   Alcoholic hepatitis Active Problems:   Rhabdomyolysis   Hypoglycemia   Metabolic acidosis with increased anion gap and accumulation of organic acids   Hyponatremia   Hypotension   Thrombocytopenia (HCC)   Plan: Acute hepatitis: Combination of acute alcoholic hepatitis and rhabdomyolysis plus cocaine use LFTs are downtrending Acute viral hepatitis panel negative Replete electrolytes aggressively Continue multivitamin plus thiamine plus folate daily Adequate nutrition Complete abstinence from alcohol use  Rhabdomyolysis: Resolved Total CK is significantly improved  Normocytic anemia: No evidence of active GI bleed Hemoglobin is worse, secondary to hemodilution as well as component of anemia of chronic disease plus malnutrition plus bone marrow suppression secondary to alcohol abuse  Follow-up with GI as outpatient in 1 month  GI will sign off at this time, please call us back  with questions or concerns    LOS: 3 days   Kc Sedlak 02/16/2019, 5:04 PM

## 2019-02-16 NOTE — Consult Note (Signed)
PHARMACY CONSULT NOTE - FOLLOW UP  Pharmacy Consult for Electrolyte Monitoring and Replacement   Recent Labs: Potassium (mmol/L)  Date Value  02/16/2019 3.4 (L)   Magnesium (mg/dL)  Date Value  53/64/6803 1.9   Calcium (mg/dL)  Date Value  21/22/4825 7.6 (L)   Albumin (g/dL)  Date Value  00/37/0488 1.6 (L)   Phosphorus (mg/dL)  Date Value  89/16/9450 1.5 (L)   Sodium (mmol/L)  Date Value  02/16/2019 129 (L)     Assessment: 44 y.o.femalewith medical history significant for polysubstance abuse (opioids, cocaine, alcohol) chronic anemia, peripheral neuropathy, who presented to the hospital with generalized weakness and body pains.  Goal of Therapy:  Lab WNL   Plan:  Will give potassium phosphate IV 15 mmol x 1 Will give KCl 40 mEq x 1  Will order Mg, BMP, and Phos with AM labs.   Ronnald Ramp ,PharmD, BCPS Clinical Pharmacist 02/16/2019 8:47 AM
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
## 2019-02-16 NOTE — Evaluation (Signed)
Occupational Therapy Evaluation Patient Details Name: Ellen Smith MRN: [id number] DOB: Apr 15, 1975 Today's Date: 02/16/2019    History of Present Illness 44 y.o. female with medical history significant for polysubstance abuse (opioids, cocaine, alcohol) chronic anemia, peripheral neuropathy, who presented to the hospital with generalized weakness and body pains.  She said she has had pain in her legs for about 3 months now.  Pain has gotten worse.  She noticed that the pain was more severe in the left leg.  She said she had to use a cane to walk because she also had some weakness in her left leg and now is having it in her right leg   Clinical Impression   Pt was seen for OT evaluation this date. Per pt report, she experienced a drug overdose a couple months ago. Was independent prior to that but has since experienced increased weakness and numbness in her legs requiring increased assist from AD for mobility and assist from fiance for safe tub transfers, but was still able to perform basic ADL tasks without assist until day of current admission. Pt lives with her fiance in a 2 story home but able to live on the main level with a tub/shower. Currently pt demonstrates impairments as described below (See OT problem list below) which functionally limit her ability to perform ADL/self-care tasks. Pt currently requires mod-max assist for bed mobility, and +2 assist for all OOB attempts. Pt instructed in functional transfer training and bed mobility training requiring cues for hand and foot placement as well as RW mgt and pursed lip breathing to support breath recovery after exertional activity. HR up to 120 with bed mobility and transfer attempts. O2 sats on room air WNL. Pt would benefit from skilled OT to address noted impairments and functional limitations (see below for any additional details) in order to maximize safety and independence while minimizing falls risk and caregiver burden.  Upon hospital  discharge, recommend pt discharge to SNF to continue therapy.    Follow Up Recommendations  SNF    Equipment Recommendations  None recommended by OT    Recommendations for Other Services       Precautions / Restrictions Precautions Precautions: Fall Restrictions Weight Bearing Restrictions: No      Mobility Bed Mobility Overal bed mobility: Needs Assistance Bed Mobility: Supine to Sit;Sit to Supine     Supine to sit: Mod assist;HOB elevated Sit to supine: Mod assist      Transfers Overall transfer level: Needs assistance Equipment used: Rolling walker (2 wheeled) Transfers: Sit to/from Stand;Lateral/Scoot Transfers Sit to Stand: Max assist;From elevated surface        Lateral/Scoot Transfers: Max assist;From elevated surface General transfer comment: attempted with Max Assist, able to briefly clear buttocks from EOB, but quickly needed to sit back down; Max for lateral scoots but unable to clear buttocks    Balance Overall balance assessment: Needs assistance;History of Falls Sitting-balance support: Feet unsupported;Bilateral upper extremity supported Sitting balance-Leahy Scale: Fair       Standing balance-Leahy Scale: Zero                             ADL either performed or assessed with clinical judgement   ADL Overall ADL's : Needs assistance/impaired Eating/Feeding: Modified independent;Bed level   Grooming: Sitting;Set up   Upper Body Bathing: Sitting;Minimal assistance   Lower Body Bathing: Sitting/lateral leans;Maximal assistance   Upper Body Dressing : Sitting;Minimal assistance   Lower Body Dressing:  Sitting/lateral leans;Maximal assistance     Toilet Transfer Details (indicate cue type and reason): unable to attempt                 Vision Baseline Vision/History: Wears glasses Wears Glasses: At all times Patient Visual Report: No change from baseline       Perception     Praxis      Pertinent Vitals/Pain  Pain Assessment: 0-10 Pain Score: 7  Pain Location: B LE with movement Pain Descriptors / Indicators: Aching Pain Intervention(s): Limited activity within patient's tolerance;Monitored during session;Repositioned     Hand Dominance Right   Extremity/Trunk Assessment Upper Extremity Assessment Upper Extremity Assessment: Generalized weakness(B UE grossly 4/5, grip strength 4/5, pinch strength 3+/5)   Lower Extremity Assessment Lower Extremity Assessment: Generalized weakness(B LE grossly 2+/5, numbness in B feet from ankles, 0/5 DF, 2+/5 PF, mild swelling and tightness noted in B thighs, tender to touch)   Cervical / Trunk Assessment Cervical / Trunk Assessment: Normal   Communication Communication Communication: No difficulties   Cognition Arousal/Alertness: Awake/alert Behavior During Therapy: WFL for tasks assessed/performed Overall Cognitive Status: Within Functional Limits for tasks assessed                                     General Comments  BLE edema noted in thighs, tightness, tender to touch in B legs distal of knees, no redness, both feet distal of ankles numb with 0/5 dorsiflexion and weak plantar flexion. Pt endorses this began slowly has worsened since a drug overdose a couple months ago. MD notified.    Exercises Other Exercises Other Exercises: Functional transfer training and bed mobility training, instructed in pursed lip breathing to support breath recovery after exertional activity, HR up to 120 with bed mobility and transfer attempts   Shoulder Instructions      Home Living Family/patient expects to be discharged to:: Private residence Living Arrangements: Spouse/significant other(fiance) Available Help at Discharge: Family;Available PRN/intermittently;Friend(s)(fiance works full time, friends not reliable) Type of Home: House Home Access: Stairs to enter [employer] of Steps: 1 Entrance Stairs-Rails: None Home Layout: Two  level;Able to live on main level with bedroom/bathroom     Bathroom Shower/Tub: Chief Strategy Officer: Standard     Home Equipment: Environmental consultant - 2 wheels;Bedside commode;Hand held shower head          Prior Functioning/Environment Level of Independence: Needs assistance  Gait / Transfers Assistance Needed: prior to Sept (pt endorses drug overdose at that time), completely indep, then incidious weakness occurred needing RW to mobilize ADL's / Homemaking Assistance Needed: Since Sept, has required increasing assist for ADL, fiance providing supervision assist for tub transfers, but otherwise pt was able to perform basic ADL, light meal prep, and med mgt up until day or two before hospitalization   Comments: now relies on finace for most mobility. Per pt, 50+ falls in past 12 months due to B LE buckling. Spends lots of time in bed. Uses RW for short distances        OT Problem List: Decreased strength;Decreased range of motion;Pain;Impaired sensation;Increased edema;Decreased activity tolerance;Impaired balance (sitting and/or standing);Decreased knowledge of use of DME or AE      OT Treatment/Interventions: Self-care/ADL training;Therapeutic exercise;Therapeutic activities;DME and/or AE instruction;Patient/family education;Balance training;Manual therapy    OT Goals(Current goals can be found in the care plan section) Acute Rehab OT Goals Patient Stated Goal: to  get better OT Goal Formulation: With patient Time For Goal Achievement: 03/02/19 Potential to Achieve Goals: Good ADL Goals Pt Will Perform Lower Body Dressing: with mod assist;sit to/from stand;with adaptive equipment Pt Will Transfer to Toilet: with mod assist;stand pivot transfer;bedside commode Pt Will Perform Toileting - Clothing Manipulation and hygiene: with mod assist;sitting/lateral leans  OT Frequency: Min 2X/week   Barriers to D/C:            Co-evaluation              AM-PAC OT "6 Clicks"  Daily Activity     Outcome Measure Help from another person eating meals?: None Help from another person taking care of personal grooming?: A Little Help from another person toileting, which includes using toliet, bedpan, or urinal?: Total Help from another person bathing (including washing, rinsing, drying)?: A Lot Help from another person to put on and taking off regular upper body clothing?: A Little Help from another person to put on and taking off regular lower body clothing?: A Lot 6 Click Score: 15   End of Session Equipment Utilized During Treatment: Gait belt  Activity Tolerance: Patient tolerated treatment well Patient left: in bed;with call bell/phone within reach;with bed alarm set  OT Visit Diagnosis: Other abnormalities of gait and mobility (R26.89);Repeated falls (R29.6)                Time: 4097-3532 OT Time Calculation (min): 38 min Charges:  OT General Charges $OT Visit: 1 Visit OT Evaluation $OT Eval Moderate Complexity: 1 Mod OT Treatments $Therapeutic Activity: 23-37 mins  Richrd Prime, MPH, MS, OTR/L ascom [phone] 02/16/19, 2:08 PM

## 2019-02-17 ENCOUNTER — Inpatient Hospital Stay: Payer: Self-pay

## 2019-02-17 LAB — BASIC METABOLIC PANEL
Anion gap: 4 — ABNORMAL LOW (ref 5–15)
BUN: 15 mg/dL (ref 6–20)
CO2: 25 mmol/L (ref 22–32)
Calcium: 7.7 mg/dL — ABNORMAL LOW (ref 8.9–10.3)
Chloride: 101 mmol/L (ref 98–111)
Creatinine, Ser: 0.69 mg/dL (ref 0.44–1.00)
GFR calc Af Amer: >60 mL/min (ref >60)
GFR calc non Af Amer: >60 mL/min (ref >60)
Glucose, Bld: 95 mg/dL (ref 70–99)
Potassium: 3.8 mmol/L (ref 3.5–5.1)
Sodium: 130 mmol/L — ABNORMAL LOW (ref 135–145)

## 2019-02-17 LAB — CBC
HCT: 26.1 % — ABNORMAL LOW (ref 36.0–46.0)
Hemoglobin: 8 g/dL — ABNORMAL LOW (ref 12.0–15.0)
MCH: 29.1 pg (ref 26.0–34.0)
MCHC: 30.7 g/dL (ref 30.0–36.0)
MCV: 94.9 fL (ref 80.0–100.0)
Platelets: 87 10*3/uL — ABNORMAL LOW (ref 150–400)
RBC: 2.75 MIL/uL — ABNORMAL LOW (ref 3.87–5.11)
RDW: 23.7 % — ABNORMAL HIGH (ref 11.5–15.5)
WBC: 6.5 10*3/uL (ref 4.0–10.5)
nRBC: 0.3 % — ABNORMAL HIGH (ref 0.0–0.2)

## 2019-02-17 LAB — PROTIME-INR
INR: 1 (ref 0.8–1.2)
Prothrombin Time: 13.4 seconds (ref 11.4–15.2)

## 2019-02-17 LAB — HEPATIC FUNCTION PANEL
ALT: 132 U/L — ABNORMAL HIGH (ref 0–44)
AST: 146 U/L — ABNORMAL HIGH (ref 15–41)
Albumin: 1.7 g/dL — ABNORMAL LOW (ref 3.5–5.0)
Alkaline Phosphatase: 786 U/L — ABNORMAL HIGH (ref 38–126)
Bilirubin, Direct: 0.9 mg/dL — ABNORMAL HIGH (ref 0.0–0.2)
Indirect Bilirubin: 1.1 mg/dL — ABNORMAL HIGH (ref 0.3–0.9)
Total Bilirubin: 2 mg/dL — ABNORMAL HIGH (ref 0.3–1.2)
Total Protein: 4.6 g/dL — ABNORMAL LOW (ref 6.5–8.1)

## 2019-02-17 LAB — PHOSPHORUS: Phosphorus: 2 mg/dL — ABNORMAL LOW (ref 2.5–4.6)

## 2019-02-17 LAB — AMMONIA: Ammonia: 34 umol/L (ref 9–35)

## 2019-02-17 LAB — CK: Total CK: 1179 U/L — ABNORMAL HIGH (ref 38–234)

## 2019-02-17 LAB — MAGNESIUM: Magnesium: 2.1 mg/dL (ref 1.7–2.4)

## 2019-02-17 MED ORDER — K PHOS MONO-SOD PHOS DI & MONO 155-852-130 MG PO TABS
500.0000 mg | ORAL_TABLET | ORAL | Status: AC
  Administered 2019-02-17 (×4): 500 mg via ORAL
  Filled 2019-02-17 (×4): qty 2

## 2019-02-17 NOTE — Progress Notes (Signed)
Cleansed both wounds on left and right ankles. Measured length, width of wound and documented in flowsheet. Applied foam dressings on both ankles. Patient tolerated well. No complaints of pain at wound site.

## 2019-02-17 NOTE — Consult Note (Addendum)
PHARMACY CONSULT NOTE - FOLLOW UP  Pharmacy Consult for Electrolyte Monitoring and Replacement   Recent Labs: Potassium (mmol/L)  Date Value  02/17/2019 3.8   Magnesium (mg/dL)  Date Value  85/50/1586 2.1   Calcium (mg/dL)  Date Value  82/57/4935 7.7 (L)   Albumin (g/dL)  Date Value  52/17/4715 1.7 (L)   Phosphorus (mg/dL)  Date Value  95/39/6728 2.0 (L)   Sodium (mmol/L)  Date Value  02/17/2019 130 (L)     Assessment: 43 y.o.femalewith medical history significant for polysubstance abuse (opioids, cocaine, alcohol) chronic anemia, peripheral neuropathy, who presented to the hospital with generalized weakness and body pains.  Goal of Therapy:  Lab WNL   Plan:  Will give Kphos tabs 2 tabs q4H x 4. Pt did not tolerate the potassium phos infusion yesterday, had to slow infusion down.   Will order BMP, and Phos with AM labs.   Ronnald Ramp ,PharmD, BCPS Clinical Pharmacist 02/17/2019 7:40 AM
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
## 2019-02-17 NOTE — Progress Notes (Signed)
PT Cancellation Note  Patient Details Name: Ellen Smith MRN: [id number] DOB: 09-Mar-1975   Cancelled Treatment:    Reason Eval/Treat Not Completed: Other (comment)(Pt eating breakfast at this time, politely declined PT. PT will follow up as able.)   Olga Coaster PT, DPT 8:50 AM,02/17/19

## 2019-02-18 LAB — PHOSPHORUS: Phosphorus: 4 mg/dL (ref 2.5–4.6)

## 2019-02-18 LAB — BASIC METABOLIC PANEL
Anion gap: 8 (ref 5–15)
BUN: 13 mg/dL (ref 6–20)
CO2: 25 mmol/L (ref 22–32)
Calcium: 7.8 mg/dL — ABNORMAL LOW (ref 8.9–10.3)
Chloride: 100 mmol/L (ref 98–111)
Creatinine, Ser: 0.64 mg/dL (ref 0.44–1.00)
GFR calc Af Amer: >60 mL/min (ref >60)
GFR calc non Af Amer: >60 mL/min (ref >60)
Glucose, Bld: 88 mg/dL (ref 70–99)
Potassium: 2.8 mmol/L — ABNORMAL LOW (ref 3.5–5.1)
Sodium: 133 mmol/L — ABNORMAL LOW (ref 135–145)

## 2019-02-18 LAB — CULTURE, BLOOD (ROUTINE X 2)
Culture: NO GROWTH
Culture: NO GROWTH
Special Requests: ADEQUATE
Special Requests: ADEQUATE

## 2019-02-18 MED ORDER — POTASSIUM CHLORIDE CRYS ER 20 MEQ PO TBCR
40.0000 meq | EXTENDED_RELEASE_TABLET | ORAL | Status: AC
  Administered 2019-02-18 (×2): 40 meq via ORAL
  Filled 2019-02-18 (×2): qty 2

## 2019-02-18 MED ORDER — GABAPENTIN 100 MG PO CAPS
100.0000 mg | ORAL_CAPSULE | Freq: Two times a day (BID) | ORAL | Status: DC
  Administered 2019-02-18 – 2019-02-19 (×3): 100 mg via ORAL
  Filled 2019-02-18 (×4): qty 1

## 2019-02-18 NOTE — TOC Progression Note (Signed)
Transition of Care [facility]) - Progression Note    Patient Details  Name: Ellen Smith MRN: [id number] Date of Birth: [birth year]  Transition of Care [facility]) CM/SW [place], RN Phone Number: 02/18/2019, 10:53 AM  Clinical Narrative:      Met with patient this morning to discuss recommendation for SNF.  Patient is agreeable to go to SNF.  She is concerned.  She has no income.  Patient was screened by Financial Planning on 02/15/2019.  She is still not medicaid eligible at this time.  They reported she would need a 12 month disability and a supporting diagnosis.    Expected Discharge Plan: Home/Self Care Barriers to Discharge: Continued Medical Work up  Expected Discharge Plan and Services Expected Discharge Plan: Home/Self Care   Discharge Planning Services: CM Consult   Living arrangements for the past 2 months: Single Family Home                                       Social Determinants of Health (SDOH) Interventions    Readmission Risk Interventions No flowsheet data found.
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
## 2019-02-18 NOTE — Progress Notes (Signed)
Physical Therapy Treatment Patient Details Name: Ellen Smith MRN: [id number] DOB: 1975-06-26 Today's Date: 02/18/2019    History of Present Illness 44 y.o. female with medical history significant for polysubstance abuse (opioids, cocaine, alcohol) chronic anemia, peripheral neuropathy, who presented to the hospital with generalized weakness and body pains.  She said she has had pain in her legs for about 3 months now.  Pain has gotten worse.  She noticed that the pain was more severe in the left leg.  She said she had to use a cane to walk because she also had some weakness in her left leg and now is having it in her right leg    PT Comments    Patient alert, agreeable to PT and OT, eager to mobilize. Overall the patient did demonstrate progression towards goals. modA for log rolling technique to sit EOB for several minutes. MD in room to speak with patient, pt with fair balance, preferred at least unilateral UE support. Sit <> stand with RW and maxA performed twice. Pt exhibited excessive retropulsion of bilateral knees and bilateral foot supination, reliant on posterior support of bed rails, unable to come fully into standing. Lateral scoot to recliner performed with modAx2 and step by step sequencing. Patient up in chair with all needs in reach, RN informed of use of hoyer lift to return to bed safely. The patient would benefit from further skilled PT intervention to maximize safety, mobility, and independence.     Follow Up Recommendations  SNF     Equipment Recommendations  Wheelchair (measurements PT)    Recommendations for Other Services       Precautions / Restrictions Precautions Precautions: Fall Restrictions Weight Bearing Restrictions: No    Mobility  Bed Mobility Overal bed mobility: Needs Assistance Bed Mobility: Rolling;Sit to Sidelying Rolling: Min assist       Sit to sidelying: Mod assist General bed mobility comments: needs assistance for trunk elevation and LE  management  Transfers Overall transfer level: Needs assistance Equipment used: Rolling walker (2 wheeled) Transfers: Sit to/from Stand;Lateral/Scoot Transfers Sit to Stand: Max assist;+2 physical assistance;From elevated surface        Lateral/Scoot Transfers: Mod assist;+2 physical assistance General transfer comment: sit to stand performed twice; pt relied heavily on bed rail for posterior leg support, excessive retropulsion/extension of knees noted bilaterally, pt unable to stand fully upright with maxAx2 and RW. Fatigued quickly.  Ambulation/Gait             General Gait Details: deferred   Stairs             Wheelchair Mobility    Modified Rankin (Stroke Patients Only)       Balance Overall balance assessment: Needs assistance Sitting-balance support: Feet supported Sitting balance-Leahy Scale: Fair Sitting balance - Comments: pt more comfortable with at least unilateral UE support     Standing balance-Leahy Scale: Zero                              Cognition Arousal/Alertness: Awake/alert Behavior During Therapy: WFL for tasks assessed/performed Overall Cognitive Status: Within Functional Limits for tasks assessed                                        Exercises General Exercises - Lower Extremity Quad Sets: AROM;Strengthening;Both;5 reps Heel Slides: AROM;Strengthening;Both;5 reps Hip ABduction/ADduction: AROM;Strengthening;Both;5 reps  Other Exercises Other Exercises: Pt given exercise handout and encouarged to perform 2-3x a day to improve strength    General Comments General comments (skin integrity, edema, etc.): Pt HR monitored intermittently. With exertion HR up into 140s, does recover to 100s-110s quickly with rest and cues for breathing.      Pertinent Vitals/Pain Pain Assessment: 0-10 Pain Score: 3  Pain Location: LE movement, increased to 6/10 Pain Descriptors / Indicators: Aching;Sore Pain  Intervention(s): Limited activity within patient's tolerance;Monitored during session;Premedicated before session;Repositioned    Home Living                      Prior Function            PT Goals (current goals can now be found in the care plan section) Progress towards PT goals: Progressing toward goals    Frequency    Min 2X/week      PT Plan Current plan remains appropriate    Co-evaluation   Reason for Co-Treatment: Complexity of the patient's impairments (multi-system involvement);For patient/therapist safety PT goals addressed during session: Mobility/safety with mobility;Balance;Proper use of DME;Strengthening/ROM OT goals addressed during session: Proper use of Adaptive equipment and DME      AM-PAC PT "6 Clicks" Mobility   Outcome Measure  Help needed turning from your back to your side while in a flat bed without using bedrails?: A Lot Help needed moving from lying on your back to sitting on the side of a flat bed without using bedrails?: A Lot Help needed moving to and from a bed to a chair (including a wheelchair)?: A Lot Help needed standing up from a chair using your arms (e.g., wheelchair or bedside chair)?: A Lot Help needed to walk in hospital room?: Total Help needed climbing 3-5 steps with a railing? : Total 6 Click Score: 10    End of Session Equipment Utilized During Treatment: Gait belt Activity Tolerance: Patient tolerated treatment well Patient left: with chair alarm set;in chair;with call bell/phone within reach Nurse Communication: Mobility status;Need for lift equipment PT Visit Diagnosis: Muscle weakness (generalized) (M62.81);Repeated falls (R29.6);History of falling (Z91.81);Difficulty in walking, not elsewhere classified (R26.2);Pain Pain - Right/Left: (bilateral) Pain - part of body: Leg     Time: 1343-1414 PT Time Calculation (min) (ACUTE ONLY): 31 min  Charges:  $Therapeutic Activity: 8-22 mins                      Lieutenant Diego PT, DPT 3:36 PM,02/18/19

## 2019-02-18 NOTE — Consult Note (Addendum)
PHARMACY CONSULT NOTE - FOLLOW UP  Pharmacy Consult for Electrolyte Monitoring and Replacement   Recent Labs: Potassium (mmol/L)  Date Value  02/18/2019 2.8 (L)   Magnesium (mg/dL)  Date Value  99/69/2493 2.1   Calcium (mg/dL)  Date Value  24/19/9144 7.8 (L)   Albumin (g/dL)  Date Value  45/84/8350 1.7 (L)   Phosphorus (mg/dL)  Date Value  75/73/2256 4.0   Sodium (mmol/L)  Date Value  02/18/2019 133 (L)     Assessment: 43 y.o.femalewith medical history significant for polysubstance abuse (opioids, cocaine, alcohol) chronic anemia, peripheral neuropathy, who presented to the hospital with generalized weakness and body pains.  Goal of Therapy:  Lab WNL   Plan:  Will give KCl 40 mEq every 4 hours x 2. Pt was not able to tolerate potassium phosphate infusion from 2 days ago, predict same issue will occur with potassium IV runs. Will stick with PO for now.    Will order BMP, and Mg with AM labs.   Ronnald Ramp ,PharmD, BCPS Clinical Pharmacist 02/18/2019 7:28 AM

## 2019-02-18 NOTE — TOC Initial Note (Signed)
Transition of Care [facility]) - Initial/Assessment Note    Patient Details  Name: Ellen Smith MRN: [id number] Date of Birth: [birth year]  [facility]) CM/SW Contact:    Victorino Dike, RN Phone Number: 02/18/2019, 10:08 AM  Clinical Narrative:                   Met with patient, she currently lives with girlfriend in single family home.  She repots unable to care for herself at home.  She also reports not being able to work due to covid.  Expected Discharge Plan: Home/Self Care Barriers to Discharge: Continued Medical Work up   Patient Goals and CMS Choice        Expected Discharge Plan and Services Expected Discharge Plan: Home/Self Care   Discharge Planning Services: CM Consult   Living arrangements for the past 2 months: Single Family Home                                      Prior Living Arrangements/Services Living arrangements for the past 2 months: Single Family Home Lives with:: Significant Other Patient language and need for interpreter reviewed:: Yes Do you feel safe going back to the place where you live?: Yes      Need for Family Participation in Patient Care: No (Comment) Care giver support system in place?: Yes (comment)   Criminal Activity/Legal Involvement Pertinent to Current Situation/Hospitalization: No - Comment as needed  Activities of Daily Living Home Assistive Devices/Equipment: Contact lenses, Dentures (specify type), Walker (specify type) ADL Screening (condition at time of admission) Patient's cognitive ability adequate to safely complete daily activities?: Yes Is the patient deaf or have difficulty hearing?: Yes Does the patient have difficulty seeing, even when wearing glasses/contacts?: No Does the patient have difficulty concentrating, remembering, or making decisions?: No Patient able to express need for assistance with ADLs?: Yes Does the patient have difficulty dressing or bathing?: Yes Independently performs  ADLs?: No Does the patient have difficulty walking or climbing stairs?: Yes Weakness of Legs: Both Weakness of Arms/Hands: Both  Permission Sought/Granted                  Emotional Assessment Appearance:: Appears older than stated age Attitude/Demeanor/Rapport: Engaged Affect (typically observed): Hopeless, Appropriate Orientation: : Oriented to Self, Oriented to Place, Oriented to  Time, Oriented to Situation Alcohol / Substance Use: Not Applicable Psych Involvement: No (comment)  Admission diagnosis:  Hypoglycemia [E16.2] Acute liver failure [K72.00] Right leg weakness [R29.898] Non-traumatic rhabdomyolysis [M62.82] Acute liver failure without hepatic coma [K72.00] Patient Active Problem List   Diagnosis Date Noted  . Alcoholic hepatitis 70/17/7939  . Rhabdomyolysis 02/13/2019  . Hypoglycemia 02/13/2019  . Metabolic acidosis with increased anion gap and accumulation of organic acids 02/13/2019  . Hyponatremia 02/13/2019  . Hypotension 02/13/2019  . Thrombocytopenia ([place]) 02/13/2019  . Overdose 09/02/2018  . Alcohol dependence ([place]) 09/02/2018  . Opiate abuse, episodic ([place]) 09/02/2018  . MDD (major depressive disorder), severe ([place]) 09/02/2018   PCP:  Patient, No Pcp Per Pharmacy:   [facility] [address], [place] [address] Phone: [phone] Fax: [phone]  [facility] [address]), [place] - [place]) [place] [address] Phone: [phone] Fax: [phone]     Social Determinants of Health (SDOH) Interventions    Readmission  Risk Interventions No flowsheet data found.

## 2019-02-18 NOTE — Progress Notes (Signed)
Occupational Therapy Treatment Patient Details Name: Ellen Smith MRN: [id number] DOB: 1976-01-18 Today's Date: 02/18/2019    History of present illness 44 y.o. female with medical history significant for polysubstance abuse (opioids, cocaine, alcohol) chronic anemia, peripheral neuropathy, who presented to the hospital with generalized weakness and body pains.  She said she has had pain in her legs for about 3 months now.  Pain has gotten worse.  She noticed that the pain was more severe in the left leg.  She said she had to use a cane to walk because she also had some weakness in her left leg and now is having it in her right leg   OT comments  Pt seen for OT/PT co-tx this date, eager to participate. Bed level log roll for toileting attempts using bed pan requiring Min A to roll. Pt reporting 3/10 L>R LE pain increasing to 6/10 by end of session with functional mobility efforts. +2 Max assist for functional transfer training with significant bilat knee extension and reliance on bed to support her in standing. HR up briefly to 140's with the effort. Instruction in PLB to support breath recovery. Lateral scoots EOB to get to recliner requiring +2 assist to perform. Pt continues to endorse B feet numbness. Pt continues to benefit from OT services. Continue to strongly recommend SNF as pt requires significant skilled assist to attempt mobility and unlikely that pt has this support at home.    Follow Up Recommendations  SNF    Equipment Recommendations  3 in 1 bedside commode    Recommendations for Other Services      Precautions / Restrictions Precautions Precautions: Fall Restrictions Weight Bearing Restrictions: No       Mobility Bed Mobility Overal bed mobility: Needs Assistance Bed Mobility: Rolling;Sit to Sidelying Rolling: Min assist   Supine to sit: Mod assist;HOB elevated Sit to supine: Mod assist Sit to sidelying: Mod assist General bed mobility comments: needs assistance for  trunk elevation and LE management  Transfers Overall transfer level: Needs assistance Equipment used: Rolling walker (2 wheeled) Transfers: Sit to/from Stand;Lateral/Scoot Transfers Sit to Stand: Max assist;+2 physical assistance;From elevated surface        Lateral/Scoot Transfers: Mod assist;+2 physical assistance General transfer comment: sit to stand performed twice; pt relied heavily on bed rail for posterior leg support, excessive retropulsion/extension of knees noted bilaterally, pt unable to stand fully upright with maxAx2 and RW. Fatigued quickly.    Balance Overall balance assessment: Needs assistance Sitting-balance support: Feet supported Sitting balance-Leahy Scale: Fair Sitting balance - Comments: pt more comfortable with at least unilateral UE support     Standing balance-Leahy Scale: Zero                             ADL either performed or assessed with clinical judgement   ADL Overall ADL's : Needs assistance/impaired     Grooming: Sitting;Set up;Oral care Grooming Details (indicate cue type and reason): set up of dentures and denture powder for pt to apply                   Toilet Transfer Details (indicate cue type and reason): rolling side to side with Min assist to roll, pt attempted to use bed pan without success Toileting- Clothing Manipulation and Hygiene: Maximal assistance;Bed level       Functional mobility during ADLs: Maximal assistance;+2 for physical assistance       Vision Baseline Vision/History: Wears  glasses Wears Glasses: At all times Patient Visual Report: No change from baseline     Perception     Praxis      Cognition Arousal/Alertness: Awake/alert Behavior During Therapy: WFL for tasks assessed/performed Overall Cognitive Status: Within Functional Limits for tasks assessed                                          Exercises General Exercises - Lower Extremity Quad Sets:  AROM;Strengthening;Both;5 reps Heel Slides: AROM;Strengthening;Both;5 reps Hip ABduction/ADduction: AROM;Strengthening;Both;5 reps Other Exercises Other Exercises: Pt given exercise handout and encouarged to perform 2-3x a day to improve strength Other Exercises: additional instruction in pursed lip breathing with exertional activity to support breath recovery   Shoulder Instructions       General Comments Pt HR monitored intermittently. With exertion HR up into 140s, does recover to 100s-110s quickly with rest and cues for breathing    Pertinent Vitals/ Pain       Pain Assessment: 0-10 Pain Score: 6  Pain Location: LE movement, increased to 6/10 Pain Descriptors / Indicators: Aching;Sore Pain Intervention(s): Limited activity within patient's tolerance;Monitored during session;Premedicated before session;Repositioned  Home Living                                          Prior Functioning/Environment              Frequency  Min 2X/week        Progress Toward Goals  OT Goals(current goals can now be found in the care plan section)  Progress towards OT goals: Progressing toward goals  Acute Rehab OT Goals Patient Stated Goal: to get better OT Goal Formulation: With patient Time For Goal Achievement: 03/02/19 Potential to Achieve Goals: Good  Plan Discharge plan remains appropriate;Frequency remains appropriate    Co-evaluation    PT/OT/SLP Co-Evaluation/Treatment: Yes Reason for Co-Treatment: Complexity of the patient's impairments (multi-system involvement);For patient/therapist safety;To address functional/ADL transfers PT goals addressed during session: Mobility/safety with mobility;Balance;Proper use of DME;Strengthening/ROM OT goals addressed during session: Proper use of Adaptive equipment and DME;ADL's and self-care      AM-PAC OT "6 Clicks" Daily Activity     Outcome Measure   Help from another person eating meals?: None Help from  another person taking care of personal grooming?: A Little Help from another person toileting, which includes using toliet, bedpan, or urinal?: Total Help from another person bathing (including washing, rinsing, drying)?: A Lot Help from another person to put on and taking off regular upper body clothing?: A Little Help from another person to put on and taking off regular lower body clothing?: A Lot 6 Click Score: 15    End of Session Equipment Utilized During Treatment: Gait belt  OT Visit Diagnosis: Other abnormalities of gait and mobility (R26.89);Repeated falls (R29.6)   Activity Tolerance Patient tolerated treatment well   Patient Left in chair;with call bell/phone within reach;with chair alarm set   Nurse Communication Mobility status(Hoyer lift for back to bed with nursing)        Time: 6295-2841 OT Time Calculation (min): 32 min  Charges: OT General Charges $OT Visit: 1 Visit OT Treatments $Therapeutic Activity: 8-22 mins  Richrd Prime, MPH, MS, OTR/L ascom [phone] 02/18/19, 4:10 PM

## 2019-02-19 LAB — POTASSIUM: Potassium: 3.3 mmol/L — ABNORMAL LOW (ref 3.5–5.1)

## 2019-02-19 LAB — IRON AND TIBC
Iron: 52 ug/dL (ref 28–170)
Saturation Ratios: 21 % (ref 10.4–31.8)
TIBC: 251 ug/dL (ref 250–450)
UIBC: 199 ug/dL

## 2019-02-19 LAB — FERRITIN: Ferritin: 138 ng/mL (ref 11–307)

## 2019-02-19 LAB — MAGNESIUM: Magnesium: 1.9 mg/dL (ref 1.7–2.4)

## 2019-02-19 MED ORDER — POTASSIUM CHLORIDE CRYS ER 20 MEQ PO TBCR: 20.0000 meq | EXTENDED_RELEASE_TABLET | Freq: Every day | ORAL | 0 refills | Status: AC

## 2019-02-19 MED ORDER — ADULT MULTIVITAMIN W/MINERALS CH: 1.0000 | ORAL_TABLET | Freq: Every day | ORAL | Status: AC

## 2019-02-19 MED ORDER — TRAMADOL HCL 50 MG PO TABS: 50.0000 mg | ORAL_TABLET | Freq: Two times a day (BID) | ORAL | 0 refills | Status: AC | PRN

## 2019-02-19 MED ORDER — GABAPENTIN 100 MG PO CAPS: 100.0000 mg | ORAL_CAPSULE | Freq: Two times a day (BID) | ORAL | 3 refills | Status: AC

## 2019-02-19 MED ORDER — FOLIC ACID 1 MG PO TABS: 1.0000 mg | ORAL_TABLET | Freq: Every day | ORAL | Status: AC

## 2019-02-19 MED ORDER — THIAMINE HCL 100 MG PO TABS: 100.0000 mg | ORAL_TABLET | Freq: Every day | ORAL | Status: AC

## 2019-02-19 MED ORDER — POTASSIUM CHLORIDE CRYS ER 20 MEQ PO TBCR
20.0000 meq | EXTENDED_RELEASE_TABLET | ORAL | Status: AC
  Administered 2019-02-19 (×2): 20 meq via ORAL
  Filled 2019-02-19: qty 1

## 2019-02-19 NOTE — Discharge Summary (Signed)
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 [facility], [address], [place], [place] [address]    Special Requests   Final    NONE Performed at [facility], [address] [place], [place] [address]    Culture  Final    NO GROWTH Performed at [facility], [address], [place], [place] [address]    Report Status 02/14/2019 FINAL  Final  SARS CORONAVIRUS 2 (TAT 6-24 HRS) Nasopharyngeal Nasopharyngeal Swab     Status: None   Collection Time: 02/13/19 11:42 AM   Specimen: Nasopharyngeal Swab  Result Value Ref Range Status   SARS Coronavirus 2 NEGATIVE NEGATIVE Final    Comment: (NOTE) SARS-CoV-2 target nucleic acids are NOT DETECTED. The SARS-CoV-2 RNA is generally detectable in upper and lower respiratory specimens during the acute phase of infection.  Negative results do not preclude SARS-CoV-2 infection, do not rule out co-infections with other pathogens, and should not be used as the sole basis for treatment or other patient management decisions. Negative results must be combined with clinical observations, patient history, and epidemiological information. The expected result is Negative. Fact Sheet for Patients: HairSlick.no Fact Sheet for Healthcare Providers: quierodirigir.com This test is not yet approved or cleared by the [place] FDA and  has been authorized for detection and/or diagnosis of SARS-CoV-2 by FDA under an Emergency Use Authorization (EUA). This EUA will remain  in effect (meaning this test can be used) for the duration of the COVID-19 declaration under Section 56 4(b)(1) of the Act, 21 U.S.C. section 360bbb-3(b)(1), unless the authorization is terminated or revoked sooner. Performed at [facility], [address], [place], [place] [address]      Time coordinating discharge: 25 minutes The [place] controlled substances registry was reviewed for this patient prior to filling the <5 days supply controlled substances script.      SIGNED:   Alberteen Sam, MD  [facility] 02/19/2019, 6:41 PM

## 2019-02-19 NOTE — Consult Note (Signed)
PHARMACY CONSULT NOTE - FOLLOW UP  Pharmacy Consult for Electrolyte Monitoring and Replacement   Recent Labs: Potassium (mmol/L)  Date Value  02/19/2019 3.3 (L)   Magnesium (mg/dL)  Date Value  42/35/3614 1.9   Calcium (mg/dL)  Date Value  43/15/4008 7.8 (L)   Albumin (g/dL)  Date Value  67/61/9509 1.7 (L)   Phosphorus (mg/dL)  Date Value  32/67/1245 4.0   Sodium (mmol/L)  Date Value  02/18/2019 133 (L)     Assessment: 43 y.o.femalewith medical history significant for polysubstance abuse (opioids, cocaine, alcohol) chronic anemia, peripheral neuropathy, who presented to the hospital with generalized weakness and body pains.  Goal of Therapy:  Lab WNL   Plan: K 3.3  Mag 1.9  Scr 0.64 yesterday  Will give KCl 20 mEq every 4 hours x 2. ( Pt was not able to tolerate potassium phosphate infusion from 2 days ago, predict same issue will occur with potassium IV runs. Will stick with PO for now.  )  Will order BMP with AM labs.   Angelique Blonder ,PharmD Clinical Pharmacist 02/19/2019 7:11 AM

## 2019-02-19 NOTE — TOC Transition Note (Signed)
Transition of Care [facility]) - CM/SW Discharge Note   Patient Details  Name: Ellen Smith MRN: [id number] Date of Birth: 1975/11/05  [facility] CM/SW Contact:  Victorino Dike, RN Phone Number: 02/19/2019, 8:37 AM   Clinical Narrative:     Met with patient today, she stated "she is feeling much better and has arranged for help at home"  She talked with mother on phone and she will be able to help friend Anderson Malta provide care when Anderson Malta is at work.  She also has other friends that can assist.  DME in home:  Rolling walker, bedside commode.  DME provided this hospital stay:  Donated wheelchair from charity.  Patient can arrange her own ride home.  She understands need to be compliant with Provider visits and medications.  It was explained to patient unable to find [facility] agency due to polysubstance abuse.  Patient reported being able to stand at side of bed and pivot to bedside commode and feels she is getting stronger but unable to take steps at this time.   No other needs expressed at this time. No further TOC needs at this time, please re-consult for new needs.   Final next level of care: Home/Self Care Barriers to Discharge: Barriers Resolved   Patient Goals and CMS Choice        Discharge Placement                       Discharge Plan and Services   Discharge Planning Services: CM Consult            DME Arranged: Wheelchair manual DME Agency: ([facility] [place])       [place] Arranged: NA HH Agency: NA        Social Determinants of Health (SDOH) Interventions     Readmission Risk Interventions No flowsheet data found.

## 2019-04-06 ENCOUNTER — Other Ambulatory Visit: Payer: Self-pay

## 2019-04-06 ENCOUNTER — Encounter: Payer: Self-pay | Admitting: Gastroenterology

## 2019-04-06 ENCOUNTER — Ambulatory Visit: Payer: Self-pay | Admitting: Gastroenterology

## 2019-04-06 VITALS — BP 149/99 | HR 82 | Temp 98.4°F | Ht 64.0 in | Wt 127.6 lb

## 2019-04-06 DIAGNOSIS — B179 Acute viral hepatitis, unspecified: Secondary | ICD-10-CM

## 2019-04-06 DIAGNOSIS — L97329 Non-pressure chronic ulcer of left ankle with unspecified severity: Secondary | ICD-10-CM

## 2019-04-06 DIAGNOSIS — K7010 Alcoholic hepatitis without ascites: Secondary | ICD-10-CM

## 2019-04-06 DIAGNOSIS — L97319 Non-pressure chronic ulcer of right ankle with unspecified severity: Secondary | ICD-10-CM

## 2019-04-06 NOTE — Progress Notes (Signed)
Cephas Darby, MD [address]  [place]  [place], [place] [address]  Main: [phone]  Fax: [phone]    Gastroenterology Consultation  Referring Provider:     No ref. provider found Primary Care Physician:  Gennette Pac, FNP Primary Gastroenterologist:  Dr. Cephas Darby Reason for Consultation:   Acute alcoholic hepatitis        HPI:   Ellen Smith is a 44 y.o. female referred by Dr. Gennette Pac, [place]  for consultation & management of acute alcoholic hepatitis.  Patient was admitted to [facility] in 02/2019 secondary to acute alcoholic hepatitis, failure to thrive and severe protein calorie malnutrition.  She did not require prednisone for alcoholic hepatitis.  Interval summary Patient reports that she has been recovering well since discharge.  She feels stronger.  Able to walk with walker.  She did not restart alcohol since hospital discharge.  She did have severe peripheral neuropathy in her feet which is slowly improving.  Her numbness has resolved.  She is concerned about ulcers with purulent discharge on bilateral ankles which are painful.  She denies abdominal pain, jaundice, dark urine, nausea or vomiting, rectal bleeding, melena.  NSAIDs: None  Antiplts/Anticoagulants/Anti thrombotics: None  GI Procedures: None  Past Medical History:  Diagnosis Date  . Alcohol abuse   . Anemia   . Neuropathy     Past Surgical History:  Procedure Laterality Date  . GASTRIC BYPASS      Current Outpatient Medications:  .  baclofen (LIORESAL) 10 MG tablet, Take 10 mg by mouth 3 (three) times daily., Disp: , Rfl:  .  DULoxetine HCl (CYMBALTA PO), Take by mouth., Disp: , Rfl:  .  folic acid (FOLVITE) 1 MG tablet, Take 1 tablet (1 mg total) by mouth daily., Disp: , Rfl:  .  gabapentin (NEURONTIN) 100 MG capsule, Take 1 capsule (100 mg total) by mouth 2 (two) times daily., Disp: 60 capsule, Rfl: 3 .  Multiple Vitamin (MULTIVITAMIN WITH MINERALS) TABS tablet, Take 1 tablet  by mouth daily., Disp: , Rfl:  .  potassium chloride SA (KLOR-CON) 20 MEQ tablet, Take 1 tablet (20 mEq total) by mouth daily., Disp: 20 tablet, Rfl: 0 .  thiamine 100 MG tablet, Take 1 tablet (100 mg total) by mouth daily., Disp: , Rfl:    Family History  Problem Relation Age of Onset  . Hypertension Mother      Social History   Tobacco Use  . Smoking status: Current Every Day Smoker    Packs/day: 1.00    Years: 20.00    Pack years: 20.00    Types: Cigarettes  . Smokeless tobacco: Never Used  Substance Use Topics  . Alcohol use: Not Currently    Alcohol/week: 12.0 standard drinks    Types: 12 Cans of beer per week  . Drug use: Not Currently    Allergies as of 04/06/2019 - Review Complete 04/06/2019  Allergen Reaction Noted  . Cefaclor Hives 06/24/2016    Review of Systems:    All systems reviewed and negative except where noted in HPI.   Physical Exam:  BP (!) 149/99 (BP Location: Left Arm, Patient Position: Sitting, Cuff Size: Normal)   Pulse 82   Temp 98.4 F (36.9 C) (Oral)   Ht 5\' 4"  (1.626 m)   Wt 127 lb 9 oz (57.9 kg)   BMI 21.90 kg/m  No LMP recorded.  General:   Alert, poorly nourished, ill-appearing and cooperative in NAD Head:  Normocephalic and atraumatic.  Eyes:  Sclera clear, no icterus.   Conjunctiva pale. Ears:  Normal auditory acuity. Nose:  No deformity, discharge, or lesions. Mouth:  No deformity or lesions,oropharynx pink & moist. Neck:  Supple; no masses or thyromegaly. Lungs:  Respirations even and unlabored.  Clear throughout to auscultation.   No wheezes, crackles, or rhonchi. No acute distress. Heart:  Regular rate and rhythm; no murmurs, clicks, rubs, or gallops. Abdomen:  Normal bowel sounds. Soft, non-tender and non-distended without masses, hepatosplenomegaly or hernias noted.  No guarding or rebound tenderness.   Rectal: Not performed Msk:  Symmetrical without [place] deformities.  Severe lower extremity weakness Pulses:  Normal  pulses noted. Extremities:  No clubbing or edema.  No cyanosis. Neurologic:  Alert and oriented x3;  grossly normal neurologically. Skin: Ulcerated lesion with purulent discharge on bilateral ankles on lateral malleoli, tenderness present Psych:  Alert and cooperative. Normal mood and affect.  Imaging Studies: Reviewed  Assessment and Plan:   Ellen Smith is a 44 y.o. female with alcohol abuse, history of acute alcoholic hepatitis is seen as a hospital follow-up  History of alcoholic hepatitis Recheck LFTs today Continue multivitamin with minerals, thiamine and B complex vitamin High-protein diet Continue to maintain abstinence from alcohol use  Bilateral ankle ulcers, most likely pressure ulcers Check CBC with differential Advised her to talk to her PCP ASAP regarding antibiotic coverage Referral placed to infectious disease specialist   Follow up in 2 months   Arlyss Repress, MD

## 2019-04-07 ENCOUNTER — Telehealth: Payer: Self-pay

## 2019-04-07 DIAGNOSIS — B179 Acute viral hepatitis, unspecified: Secondary | ICD-10-CM

## 2019-04-07 LAB — CBC WITH DIFFERENTIAL/PLATELET
Basophils Absolute: 0.1 10*3/uL (ref 0.0–0.2)
Basos: 1 %
EOS (ABSOLUTE): 0.5 10*3/uL — ABNORMAL HIGH (ref 0.0–0.4)
Eos: 5 %
Hematocrit: 37.3 % (ref 34.0–46.6)
Hemoglobin: 12 g/dL (ref 11.1–15.9)
Immature Grans (Abs): 0 10*3/uL (ref 0.0–0.1)
Immature Granulocytes: 0 %
Lymphocytes Absolute: 1.8 10*3/uL (ref 0.7–3.1)
Lymphs: 17 %
MCH: 29.6 pg (ref 26.6–33.0)
MCHC: 32.2 g/dL (ref 31.5–35.7)
MCV: 92 fL (ref 79–97)
Monocytes Absolute: 1.2 10*3/uL — ABNORMAL HIGH (ref 0.1–0.9)
Monocytes: 11 %
Neutrophils Absolute: 6.9 10*3/uL (ref 1.4–7.0)
Neutrophils: 66 %
Platelets: 328 10*3/uL (ref 150–450)
RBC: 4.06 x10E6/uL (ref 3.77–5.28)
RDW: 14.3 % (ref 11.7–15.4)
WBC: 10.5 10*3/uL (ref 3.4–10.8)

## 2019-04-07 NOTE — Addendum Note (Signed)
Addended by: Radene Knee L on: 04/07/2019 02:59 PM   Modules accepted: Orders

## 2019-04-07 NOTE — Telephone Encounter (Signed)
Talk to Dr. Allegra Lai and she states she is waiting for the liver function test to returned before we order a antibiotic because antibiotic could affect the liver.  When I was looking in patient chart it does not look like the CMP was done. It says active future. Called [facility] to see if they could add on the labs to the patient labs

## 2019-04-07 NOTE — Telephone Encounter (Signed)
Patient states a antibiotic was supposed to be call in for the patient but nothing is at the pharmacy. Please advised

## 2019-04-07 NOTE — Telephone Encounter (Signed)
[facility] did not have enough blood to add it on. Called patient and she will go to [facility] to have this done

## 2019-04-14 ENCOUNTER — Ambulatory Visit: Payer: Self-pay | Admitting: Internal Medicine

## 2019-04-14 DIAGNOSIS — Z9884 Bariatric surgery status: Secondary | ICD-10-CM | POA: Insufficient documentation

## 2019-04-14 DIAGNOSIS — Z9049 Acquired absence of other specified parts of digestive tract: Secondary | ICD-10-CM | POA: Insufficient documentation

## 2019-04-14 DIAGNOSIS — D649 Anemia, unspecified: Secondary | ICD-10-CM | POA: Insufficient documentation

## 2019-04-14 DIAGNOSIS — F1910 Other psychoactive substance abuse, uncomplicated: Secondary | ICD-10-CM | POA: Insufficient documentation

## 2019-04-14 DIAGNOSIS — G629 Polyneuropathy, unspecified: Secondary | ICD-10-CM | POA: Insufficient documentation

## 2019-04-14 DIAGNOSIS — S81809A Unspecified open wound, unspecified lower leg, initial encounter: Secondary | ICD-10-CM | POA: Insufficient documentation

## 2021-08-07 IMAGING — MR MR HEAD W/O CM
11 series · 42 of 48 positions shown · non-contrast
Comparison: Head CT 02/13/2019

CLINICAL DATA: New left-sided weakness

EXAM:
MRI HEAD WITHOUT CONTRAST
TECHNIQUE: Multiplanar, multiecho pulse sequences of the brain and surrounding
structures were obtained without intravenous contrast.

[Series 5: ax dwi_tracew · axial · 3.0mm · 0.60mm/px · z∈[-68,+83]mm · 5 of 48 slices shown]
[im 1/48]
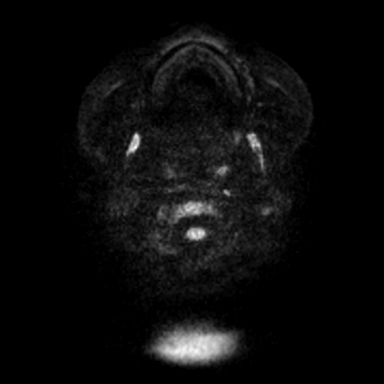
[im 12/48]
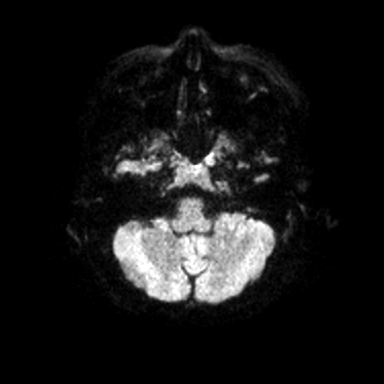
[im 24/48]
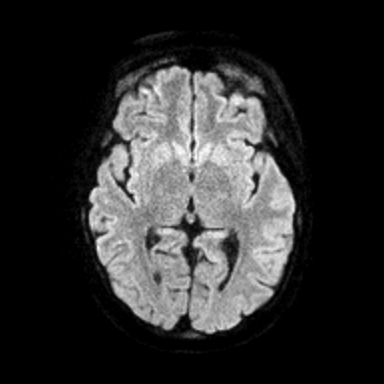
[im 36/48]
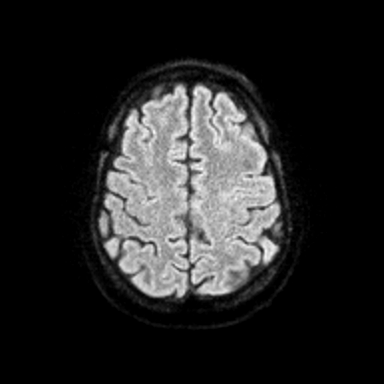
[im 48/48]
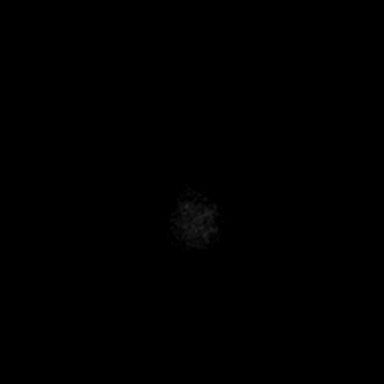

[Series 6: ax dwi_adc · axial · 3.0mm · 0.60mm/px · z∈[-68,+80]mm · 4 of 47 slices shown]
[im 1/47]
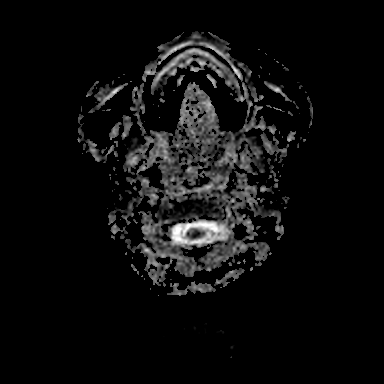
[im 16/47]
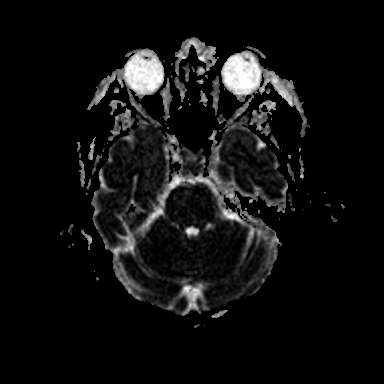
[im 31/47]
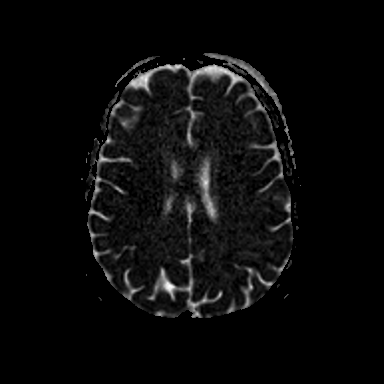
[im 47/47]
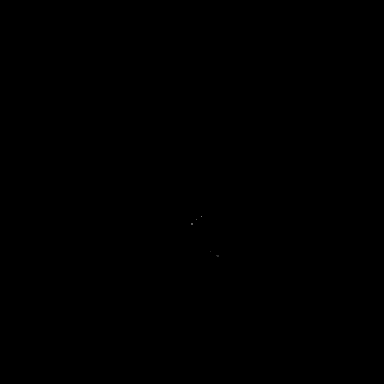

[Series 7: cor dwi_tracew · coronal · 5.0mm · 0.60mm/px · 3 of 36 slices shown]
[im 1/36]
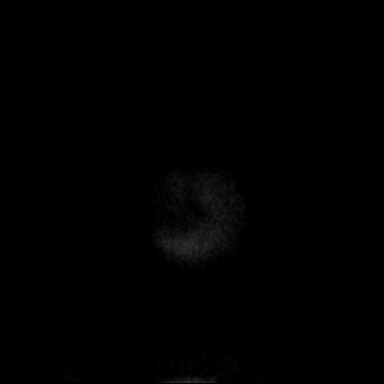
[im 18/36]
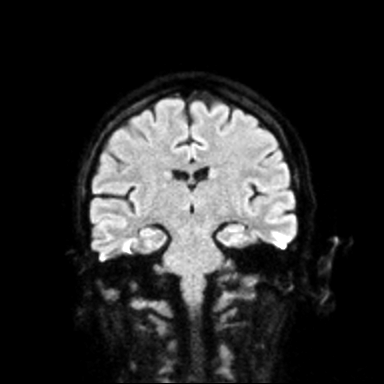
[im 36/36]
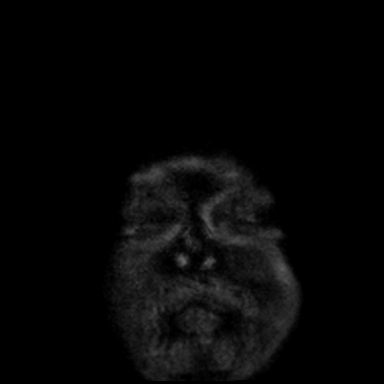

[Series 8: cor dwi_adc · coronal · 5.0mm · 0.60mm/px · 3 of 32 slices shown]
[im 1/32]
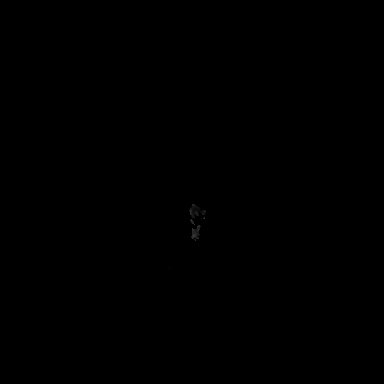
[im 16/32]
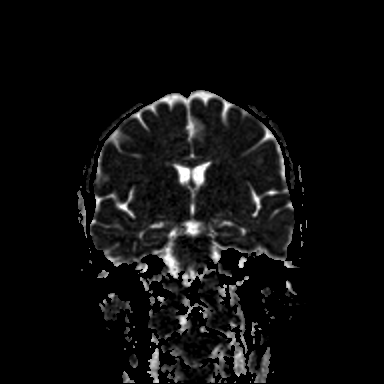
[im 32/32]
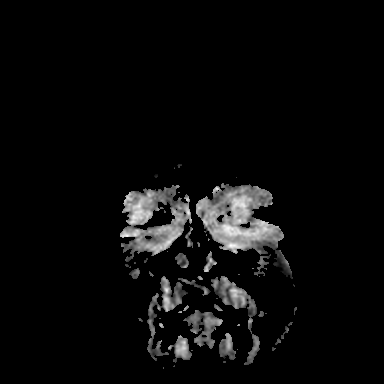

[Series 9: T1 · sagittal · 5.0mm · 0.62mm/px · 2 of 21 slices shown (1 of 2)]
[im 1/21]
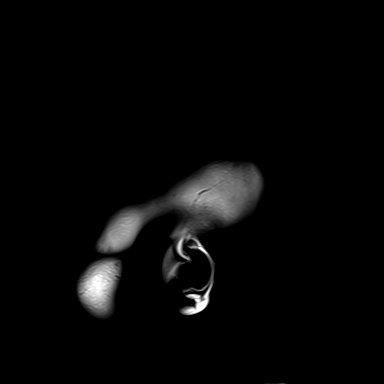
[im 21/21]
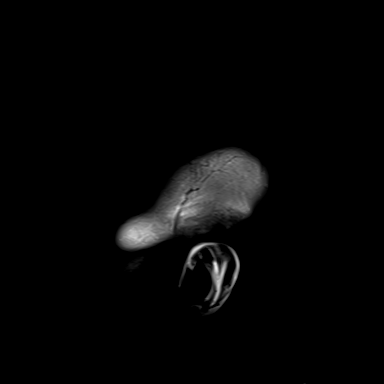

[Series 10: T2 · axial · 5.0mm · 0.53mm/px · z∈[-60,+81]mm · 2 of 25 slices shown (1 of 2)]
[im 1/25]
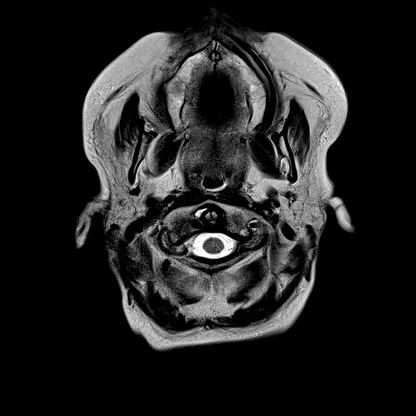
[im 25/25]
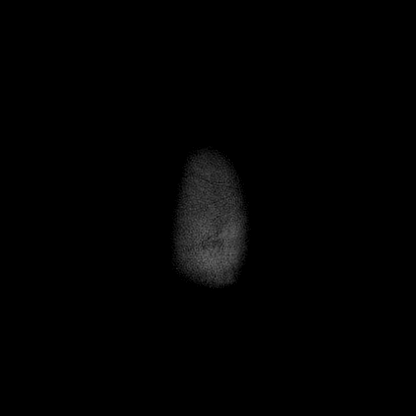

[Series 12: pha_images · axial · 3.0mm · 0.90mm/px · z∈[-75,+98]mm · 5 of 58 slices shown]
[im 1/58]
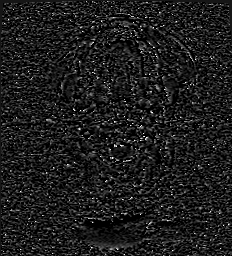
[im 15/58]
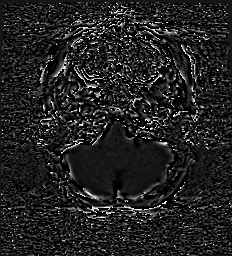
[im 29/58]
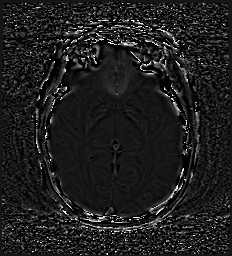
[im 43/58]
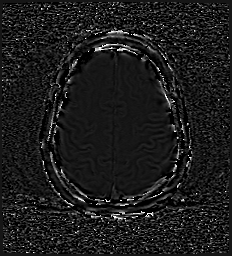
[im 58/58]
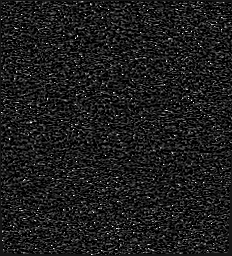

[Series 13: swi_images · axial · 3.0mm · 0.90mm/px · z∈[-75,+10]mm · 3 of 60 slices shown]
[im 1/60]
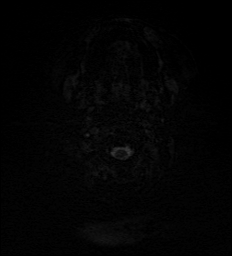
[im 15/60]
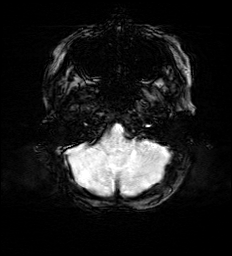
[im 30/60]
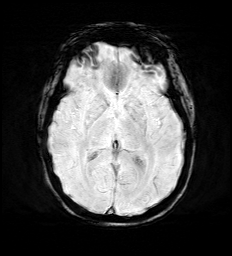

[Series 15: FLAIR · axial · 3.0mm · 0.53mm/px · z∈[-68,+90]mm · 5 of 55 slices shown]
[im 1/55]
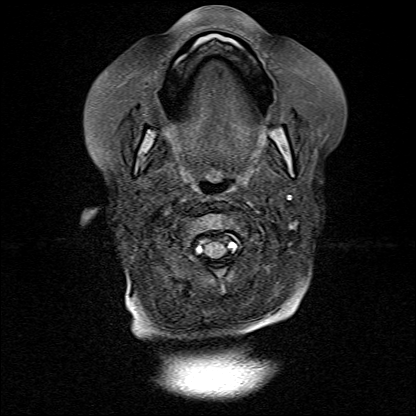
[im 14/55]
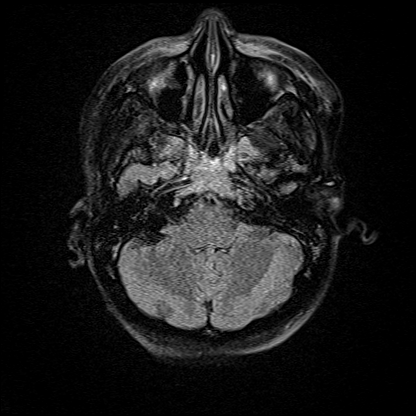
[im 28/55]
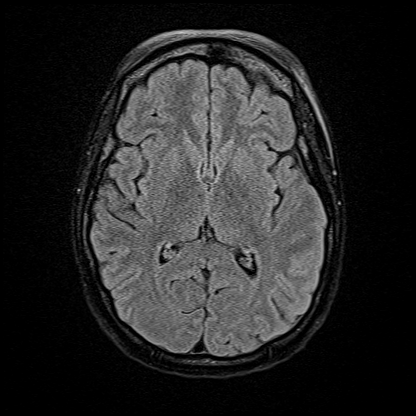
[im 41/55]
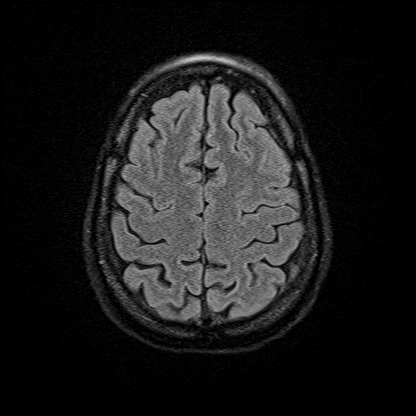
[im 55/55]
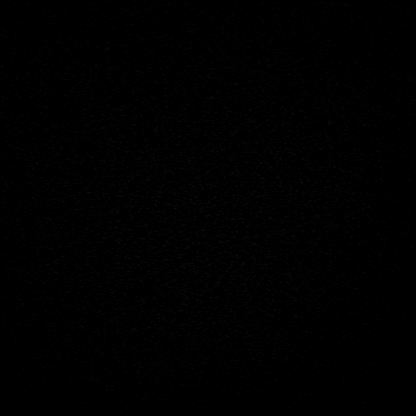

[Series 16: T1 · axial · 1.0mm · 0.98mm/px · z∈[-59,+81]mm · 8 of 144 slices shown (2 of 2)]
[im 1/144]
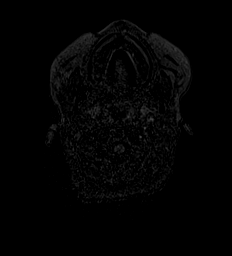
[im 27/144]
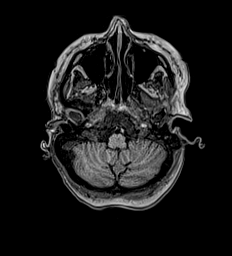
[im 40/144]
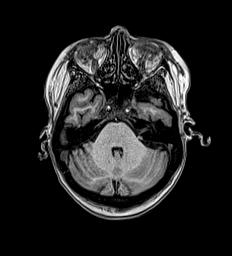
[im 66/144]
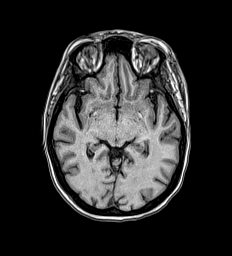
[im 79/144]
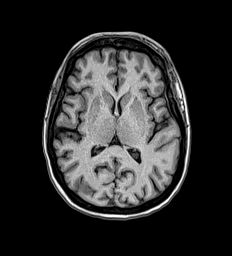
[im 105/144]
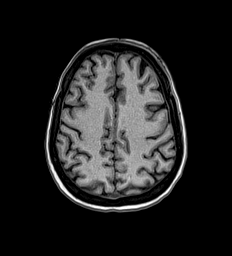
[im 118/144]
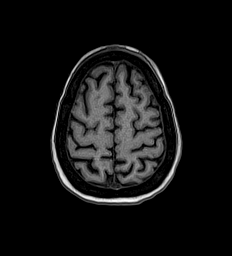
[im 144/144]
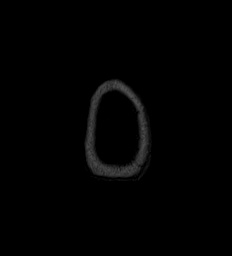

[Series 17: T2 · coronal · 5.0mm · 0.57mm/px · 2 of 27 slices shown (2 of 2)]
[im 1/27]
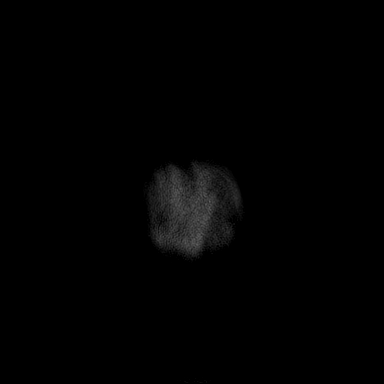
[im 27/27]
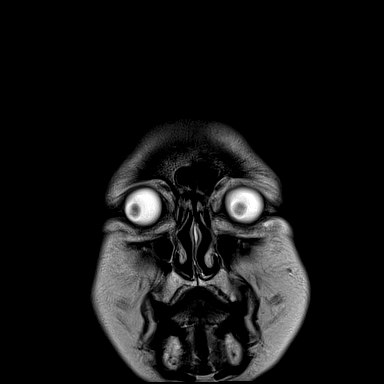

[42 of 48 positions shown; findings below may reference images not displayed]

FINDINGS: BRAIN: There is no acute infarct, acute hemorrhage or extra-axial
collection. The white matter signal is normal for the patient's age.
The cerebral and cerebellar volume are age-appropriate. There is no
hydrocephalus. The midline structures are normal.

VASCULAR: The major intracranial arterial and venous sinus flow
voids are normal. Susceptibility-sensitive sequences show no chronic
microhemorrhage or superficial siderosis.

SKULL AND UPPER CERVICAL SPINE: Calvarial bone marrow signal is
normal. There is no skull base mass. The visualized upper cervical
spine and soft tissues are normal.

SINUSES/ORBITS: There are no fluid levels or advanced mucosal
thickening. The mastoid air cells and middle ear cavities are free
of fluid. The orbits are normal.
IMPRESSION: Normal MRI of the brain.

## 2023-06-05 DEATH — deceased
